# Patient Record
Sex: Female | Born: 1975
Health system: Southern US, Community
[De-identification: ages and names within clinical notes are randomized; demographics above are authoritative.]

## PROBLEM LIST (undated history)

## (undated) DIAGNOSIS — R0600 Dyspnea, unspecified: Secondary | ICD-10-CM

## (undated) DIAGNOSIS — I10 Essential (primary) hypertension: Secondary | ICD-10-CM

## (undated) DIAGNOSIS — E0500 Thyrotoxicosis with diffuse goiter without thyrotoxic crisis or storm: Secondary | ICD-10-CM

## (undated) DIAGNOSIS — R519 Headache, unspecified: Secondary | ICD-10-CM

## (undated) DIAGNOSIS — E039 Hypothyroidism, unspecified: Secondary | ICD-10-CM

## (undated) DIAGNOSIS — M797 Fibromyalgia: Secondary | ICD-10-CM

## (undated) HISTORY — PX: APPENDECTOMY: SHX54

## (undated) HISTORY — PX: TUBAL LIGATION: SHX77

## (undated) HISTORY — PX: OTHER SURGICAL HISTORY: SHX169

## (undated) HISTORY — PX: CHOLECYSTECTOMY: SHX55

---

## 2016-11-27 ENCOUNTER — Emergency Department (HOSPITAL_COMMUNITY)
Admission: EM | Admit: 2016-11-27 | Discharge: 2016-11-28 | Disposition: A | Discharge status: Home / Self Care | Payer: BLUE CROSS/BLUE SHIELD | Attending: Emergency Medicine | Admitting: Emergency Medicine

## 2016-11-27 ENCOUNTER — Encounter (HOSPITAL_COMMUNITY): Payer: Self-pay

## 2016-11-27 ENCOUNTER — Emergency Department (HOSPITAL_COMMUNITY): Payer: BLUE CROSS/BLUE SHIELD

## 2016-11-27 DIAGNOSIS — R1032 Left lower quadrant pain: Secondary | ICD-10-CM | POA: Diagnosis present

## 2016-11-27 DIAGNOSIS — K5732 Diverticulitis of large intestine without perforation or abscess without bleeding: Secondary | ICD-10-CM | POA: Diagnosis not present

## 2016-11-27 DIAGNOSIS — R1110 Vomiting, unspecified: Secondary | ICD-10-CM | POA: Insufficient documentation

## 2016-11-27 DIAGNOSIS — R197 Diarrhea, unspecified: Secondary | ICD-10-CM | POA: Insufficient documentation

## 2016-11-27 LAB — CBC
HEMATOCRIT: 40.1 % (ref 36.0–46.0)
HEMOGLOBIN: 13 g/dL (ref 12.0–15.0)
MCH: 26.5 pg (ref 26.0–34.0)
MCHC: 32.4 g/dL (ref 30.0–36.0)
MCV: 81.8 fL (ref 78.0–100.0)
Platelets: 327 10*3/uL (ref 150–400)
RBC: 4.9 MIL/uL (ref 3.87–5.11)
RDW: 15.4 % (ref 11.5–15.5)
WBC: 10.9 10*3/uL — ABNORMAL HIGH (ref 4.0–10.5)

## 2016-11-27 LAB — I-STAT BETA HCG BLOOD, ED (MC, WL, AP ONLY): I-stat hCG, quantitative: <5 m[IU]/mL (ref <5)

## 2016-11-27 LAB — URINALYSIS, ROUTINE W REFLEX MICROSCOPIC
BILIRUBIN URINE: NEGATIVE
Glucose, UA: NEGATIVE mg/dL
KETONES UR: 5 mg/dL — AB
Nitrite: NEGATIVE
PH: 5 (ref 5.0–8.0)
PROTEIN: NEGATIVE mg/dL
RBC / HPF: NONE SEEN RBC/hpf (ref 0–5)
Specific Gravity, Urine: 1.016 (ref 1.005–1.030)

## 2016-11-27 LAB — COMPREHENSIVE METABOLIC PANEL
ALBUMIN: 4.2 g/dL (ref 3.5–5.0)
ALT: 32 U/L (ref 14–54)
ANION GAP: 8 (ref 5–15)
AST: 26 U/L (ref 15–41)
Alkaline Phosphatase: 97 U/L (ref 38–126)
BUN: 10 mg/dL (ref 6–20)
CALCIUM: 9.1 mg/dL (ref 8.9–10.3)
CO2: 26 mmol/L (ref 22–32)
Chloride: 106 mmol/L (ref 101–111)
Creatinine, Ser: 1.05 mg/dL — ABNORMAL HIGH (ref 0.44–1.00)
GFR calc non Af Amer: >60 mL/min (ref >60)
GLUCOSE: 102 mg/dL — AB (ref 65–99)
POTASSIUM: 3.4 mmol/L — AB (ref 3.5–5.1)
SODIUM: 140 mmol/L (ref 135–145)
Total Bilirubin: 0.9 mg/dL (ref 0.3–1.2)
Total Protein: 8.1 g/dL (ref 6.5–8.1)

## 2016-11-27 LAB — LIPASE, BLOOD: LIPASE: 36 U/L (ref 11–51)

## 2016-11-27 MED ORDER — IOPAMIDOL (ISOVUE-300) INJECTION 61%
INTRAVENOUS | Status: AC
  Administered 2016-11-27: 100 mL via INTRAVENOUS
  Filled 2016-11-27: qty 100

## 2016-11-27 MED ORDER — ONDANSETRON HCL 4 MG/2ML IJ SOLN
4.0000 mg | Freq: Once | INTRAMUSCULAR | Status: AC
  Administered 2016-11-27: 4 mg via INTRAVENOUS
  Filled 2016-11-27: qty 2

## 2016-11-27 MED ORDER — SODIUM CHLORIDE 0.9 % IV BOLUS (SEPSIS)
1000.0000 mL | Freq: Once | INTRAVENOUS | Status: AC
  Administered 2016-11-27: 1000 mL via INTRAVENOUS

## 2016-11-27 NOTE — ED Notes (Addendum)
Patient given water for fluid challenge per provider.

## 2016-11-27 NOTE — ED Triage Notes (Signed)
Pt with nausea and abdominal pain since Wednesday.  Went to MD today and told to come to ed.  Pt having pain bilateral.  No emesis today.

## 2016-11-27 NOTE — ED Provider Notes (Signed)
[facility]-EMERGENCY DEPT Provider Note   CSN: [id number] Arrival date & time: 11/27/16  1509     History   Chief Complaint Chief Complaint  Patient presents with  . Abdominal Pain    HPI Patty Bruce is a 41 y.o. female who presents to the emergency department with constant, sharp LLQ abdominal pain that began 2 days ago. She reports associated anorexia, green emesis x1 two days ago with watery diarrhea that began 2 days ago and has persisted through today, and back pain. She also complains of a subjective fever that began yesterday, but has resolved today. She denies dysuria, vaginal pain, vaginal discharge. She reports she has treated her symptoms with one laxative 2 days ago without improvement. No sick contacts. No recent travel.  She reports she was evaluated earlier stay by urgent care, and advised to come to the emergency department for further evaluation.  Past medical history includes fibromyalgia. No daily medications.  The history is provided by the patient. No language interpreter was used.    History reviewed. No pertinent past medical history.  There are no active problems to display for this patient.   Past Surgical History:  Procedure Laterality Date  . APPENDECTOMY    . bladder mesh    . CHOLECYSTECTOMY    . TUBAL LIGATION      OB History    No data available       Home Medications    Prior to Admission medications   Medication Sig Start Date End Date Taking? Authorizing Provider  aspirin-acetaminophen-caffeine (EXCEDRIN MIGRAINE) [phone] MG tablet Take 1 tablet by mouth every 6 (six) hours as needed for headache.   Yes [provider]  naproxen sodium (ANAPROX) 220 MG tablet Take 220 mg by mouth 2 (two) times daily as needed (pain).   Yes [provider]  ciprofloxacin (CIPRO) 500 MG tablet Take 1 tablet (500 mg total) by mouth 2 (two) times daily. 11/28/16 12/05/16  Gaynor Ferreras A, PA-C  metroNIDAZOLE (FLAGYL) 500 MG tablet Take 1  tablet (500 mg total) by mouth 3 (three) times daily. 11/28/16   Lezly Rumpf A, PA-C  ondansetron (ZOFRAN) 4 MG tablet Take 1 tablet (4 mg total) by mouth every 6 (six) hours. 11/28/16   Ersa Delaney, Coral Else, PA-C    Family History History reviewed. No pertinent family history.  Social History Social History  Substance Use Topics  . Smoking status: Never Smoker  . Smokeless tobacco: Never Used  . Alcohol use Yes     Comment: social     Allergies   Vicodin [hydrocodone-acetaminophen]   Review of Systems Review of Systems  Constitutional: Positive for appetite change, chills and fever. Negative for activity change.  Respiratory: Negative for shortness of breath.   Cardiovascular: Negative for chest pain.  Gastrointestinal: Positive for abdominal pain, diarrhea, nausea and vomiting. Negative for anal bleeding and blood in stool.  Genitourinary: Positive for vaginal bleeding. Negative for dysuria, flank pain, hematuria, menstrual problem, pelvic pain and vaginal pain.  Musculoskeletal: Positive for back pain. Negative for gait problem.  Skin: Negative for rash.  Allergic/Immunologic: Negative for immunocompromised state.  Neurological: Positive for headaches.   Physical Exam Updated Vital Signs BP (!) 130/91 (BP Location: Right Arm)   Pulse 80   Temp 98.1 F (36.7 C) (Oral)   Resp 16   Ht 5\' 5"  (1.651 m)   Wt 104.8 kg (231 lb)   LMP 11/22/2016   SpO2 95%   BMI 38.44 kg/m   Physical  Exam  Constitutional: She appears well-developed and well-nourished. No distress.  HENT:  Head: Normocephalic.  Eyes: Conjunctivae are normal.  Neck: Neck supple.  Cardiovascular: Normal rate, regular rhythm, normal heart sounds and intact distal pulses.  Exam reveals no gallop and no friction rub.   No murmur heard. Pulmonary/Chest: Effort normal and breath sounds normal. No respiratory distress. She has no wheezes. She has no rales.  Abdominal: Soft. Bowel sounds are normal. She exhibits no  distension. There is tenderness. There is no guarding.  Tender to palpation over the left lower and upper quadrant and RUQ. No rebound or guarding. No CVA tenderness bilaterally. Obese abdomen.  Musculoskeletal: Normal range of motion. She exhibits tenderness. She exhibits no deformity.  Diffuse TTP over the bilateral paraspinal muscles of the lumbar spine.   Neurological: She is alert.  Skin: Skin is warm. No rash noted. She is not diaphoretic.  Psychiatric: Her behavior is normal.  Nursing note and vitals reviewed.  ED Treatments / Results  Labs (all labs ordered are listed, but only abnormal results are displayed) Labs Reviewed  COMPREHENSIVE METABOLIC PANEL - Abnormal; Notable for the following:       Result Value   Potassium 3.4 (*)    Glucose, Bld 102 (*)    Creatinine, Ser 1.05 (*)    All other components within normal limits  CBC - Abnormal; Notable for the following:    WBC 10.9 (*)    All other components within normal limits  URINALYSIS, ROUTINE W REFLEX MICROSCOPIC - Abnormal; Notable for the following:    Hgb urine dipstick MODERATE (*)    Ketones, ur 5 (*)    Leukocytes, UA TRACE (*)    Bacteria, UA RARE (*)    Squamous Epithelial / LPF 0-5 (*)    All other components within normal limits  LIPASE, BLOOD  I-STAT BETA HCG BLOOD, ED ([facility], [facility], [facility] ONLY)    EKG  EKG Interpretation None       Radiology Ct Abdomen Pelvis W Contrast  Result Date: 11/27/2016 CLINICAL DATA:  Left lower quadrant pain EXAM: CT ABDOMEN AND PELVIS WITH CONTRAST TECHNIQUE: Multidetector CT imaging of the abdomen and pelvis was performed using the standard protocol following bolus administration of intravenous contrast. CONTRAST:  100 mL Isovue-300 COMPARISON:  None. FINDINGS: Lower chest: No pulmonary nodules or pleural effusion. No visible pericardial effusion. Hepatobiliary: Normal hepatic contours and density. No visible biliary dilatation. Status post cholecystectomy. Pancreas: Normal  contours without ductal dilatation. No peripancreatic fluid collection. Spleen: Normal. Adrenals/Urinary Tract: --Adrenal glands: Normal. --Right kidney/ureter: No hydronephrosis or perinephric stranding. No nephrolithiasis. No obstructing ureteral stones. --Left kidney/ureter: No hydronephrosis or perinephric stranding. No nephrolithiasis. No obstructing ureteral stones. --Urinary bladder: Unremarkable. Stomach/Bowel: --Stomach/Duodenum: No hiatal hernia or other gastric abnormality. Normal duodenal course and caliber. --Small bowel: No dilatation or inflammation. --Colon: There is short segment inflammation of the left lower quadrant sigmoid colon. No free intraperitoneal air or abscess formation. No other focal colonic abnormality. --Appendix: Not visualized. No right lower quadrant inflammation or free fluid. Vascular/Lymphatic: Normal course and caliber of the major abdominal vessels. No abdominal or pelvic lymphadenopathy. Reproductive: Normal uterus and ovaries. Musculoskeletal. No bony spinal canal stenosis or focal osseous abnormality. Other: None. IMPRESSION: Short segment inflammation of the left lower quadrant sigmoid colon, consistent with acute colitis. No perforation or abscess formation. Electronically Signed   By: Deatra Robinson M.D.   On: 11/27/2016 22:34    Procedures Procedures (including critical care time)  Medications  Ordered in ED Medications  metroNIDAZOLE (FLAGYL) tablet 500 mg (500 mg Oral Given 11/28/16 0031)  sodium chloride 0.9 % bolus 1,000 mL (0 mLs Intravenous Stopped 11/28/16 0054)  ondansetron (ZOFRAN) injection 4 mg (4 mg Intravenous Given 11/27/16 2233)  iopamidol (ISOVUE-300) 61 % injection (100 mLs Intravenous Contrast Given 11/27/16 2212)  ciprofloxacin (CIPRO) tablet 500 mg (500 mg Oral Given 11/28/16 0031)     Initial Impression / Assessment and Plan / ED Course  I have reviewed the triage vital signs and the nursing notes.  Pertinent labs & imaging results that  were available during my care of the patient were reviewed by me and considered in my medical decision making (see chart for details).     41 year old female presenting with abdominal pain, nausea, vomiting, and diarrhea. Afebrile. No tachycardia.WBC 10.9. CT demonstrating colitis in the sigmoid colon, concerning for diverticulitis. The patient was discussed with Dr. Ranae PalmsYelverton, attending physician, and on review of the CT images, there appeared to be several small diverticulum noted in the sigmoid colon. Fluid bolus and IV Zofran given in the ED. The patient was successfully fluid challenged. Will discharge the patient with GI follow-up, ciprofloxacin, and metronidazole; first dose given in the ED. discussed the plan for discharge with the patient, who is agreeable at this time. No acute distress. Vital signs stable. The patient is safe for discharge at this time.  Final Clinical Impressions(s) / ED Diagnoses   Final diagnoses:  Diverticulitis of large intestine without perforation or abscess without bleeding    New Prescriptions Discharge Medication List as of 11/28/2016 12:45 AM    START taking these medications   Details  ondansetron (ZOFRAN) 4 MG tablet Take 1 tablet (4 mg total) by mouth every 6 (six) hours., Starting Sat 11/28/2016, Print    ciprofloxacin (CIPRO) 500 MG tablet Take 1 tablet (500 mg total) by mouth 2 (two) times daily., Starting Sat 11/28/2016, Until Sat 12/05/2016, Print    metroNIDAZOLE (FLAGYL) 500 MG tablet Take 1 tablet (500 mg total) by mouth 2 (two) times daily., Starting Sat 11/28/2016, Print         Mahrukh Seguin A, PA-C 11/28/16 [id number]    Loren RacerYelverton, David, MD 11/29/16 [phone]

## 2016-11-27 NOTE — ED Notes (Signed)
Vital signs delayed due to patient being in CT.

## 2016-11-28 MED ORDER — METRONIDAZOLE 500 MG PO TABS: 500.0000 mg | ORAL_TABLET | Freq: Three times a day (TID) | ORAL | 0 refills | Status: AC

## 2016-11-28 MED ORDER — CIPROFLOXACIN HCL 500 MG PO TABS
500.0000 mg | ORAL_TABLET | Freq: Once | ORAL | Status: AC
  Administered 2016-11-28: 500 mg via ORAL
  Filled 2016-11-28: qty 1

## 2016-11-28 MED ORDER — METRONIDAZOLE 500 MG PO TABS: 500.0000 mg | ORAL_TABLET | Freq: Two times a day (BID) | ORAL | 0 refills | Status: DC

## 2016-11-28 MED ORDER — METRONIDAZOLE 500 MG PO TABS
500.0000 mg | ORAL_TABLET | Freq: Three times a day (TID) | ORAL | Status: DC
  Administered 2016-11-28: 500 mg via ORAL
  Filled 2016-11-28: qty 1

## 2016-11-28 MED ORDER — ONDANSETRON HCL 4 MG PO TABS: 4.0000 mg | ORAL_TABLET | Freq: Four times a day (QID) | ORAL | 0 refills | Status: AC

## 2016-11-28 MED ORDER — CIPROFLOXACIN HCL 500 MG PO TABS: 500.0000 mg | ORAL_TABLET | Freq: Two times a day (BID) | ORAL | 0 refills | Status: AC

## 2016-11-28 MED ORDER — CIPROFLOXACIN HCL 500 MG PO TABS: 500.0000 mg | ORAL_TABLET | Freq: Two times a day (BID) | ORAL | 0 refills | Status: DC

## 2016-11-28 NOTE — ED Notes (Signed)
Patient tolerating fluid well.

## 2016-11-28 NOTE — Discharge Instructions (Signed)
Please take ciprofloxacin once every 12 hours and metronidazole once every 8 hours for the next 7 days. Please follow a bland diet or a clear liquid diet until her symptoms improve. You may take Tylenol as needed if you develop a fever. You may take 1 tablet of Zofran every 6 hours as needed for nausea or vomiting.  Please call to schedule a [facility] if symptoms persist.  If you develop any new or worsening symptoms, including the inability to keep down fluids or liquids, bloody diarrhea, or severe worsening pain, particularly after he been taking antibiotics for 48 hours, please return to the emergency department for reevaluation.

## 2017-02-08 ENCOUNTER — Other Ambulatory Visit: Payer: Self-pay | Admitting: Gastroenterology

## 2017-02-08 DIAGNOSIS — K5792 Diverticulitis of intestine, part unspecified, without perforation or abscess without bleeding: Secondary | ICD-10-CM

## 2017-02-08 DIAGNOSIS — R1032 Left lower quadrant pain: Secondary | ICD-10-CM

## 2017-02-10 ENCOUNTER — Ambulatory Visit
Admission: RE | Admit: 2017-02-10 | Discharge: 2017-02-10 | Disposition: A | Discharge status: Home / Self Care | Payer: BLUE CROSS/BLUE SHIELD | Source: Ambulatory Visit | Attending: Gastroenterology | Admitting: Gastroenterology

## 2017-02-10 DIAGNOSIS — R1032 Left lower quadrant pain: Secondary | ICD-10-CM

## 2017-02-10 DIAGNOSIS — K5792 Diverticulitis of intestine, part unspecified, without perforation or abscess without bleeding: Secondary | ICD-10-CM

## 2017-02-10 MED ORDER — IOPAMIDOL (ISOVUE-300) INJECTION 61%
125.0000 mL | Freq: Once | INTRAVENOUS | Status: AC | PRN
  Administered 2017-02-10: 125 mL via INTRAVENOUS

## 2017-02-17 ENCOUNTER — Other Ambulatory Visit (HOSPITAL_COMMUNITY)
Admission: RE | Admit: 2017-02-17 | Discharge: 2017-02-17 | Disposition: A | Discharge status: Home / Self Care | Payer: BLUE CROSS/BLUE SHIELD | Source: Ambulatory Visit | Attending: Family Medicine | Admitting: Family Medicine

## 2017-02-17 ENCOUNTER — Other Ambulatory Visit: Payer: Self-pay | Admitting: Family Medicine

## 2017-02-17 DIAGNOSIS — Z01419 Encounter for gynecological examination (general) (routine) without abnormal findings: Secondary | ICD-10-CM | POA: Diagnosis not present

## 2017-02-18 LAB — CYTOLOGY - PAP: DIAGNOSIS: NEGATIVE

## 2017-04-02 ENCOUNTER — Other Ambulatory Visit: Payer: Self-pay | Admitting: Physician Assistant

## 2017-04-02 ENCOUNTER — Ambulatory Visit
Admission: RE | Admit: 2017-04-02 | Discharge: 2017-04-02 | Disposition: A | Discharge status: Home / Self Care | Payer: BLUE CROSS/BLUE SHIELD | Source: Ambulatory Visit | Attending: Physician Assistant | Admitting: Physician Assistant

## 2017-04-02 DIAGNOSIS — K5792 Diverticulitis of intestine, part unspecified, without perforation or abscess without bleeding: Secondary | ICD-10-CM

## 2017-04-02 DIAGNOSIS — R1032 Left lower quadrant pain: Secondary | ICD-10-CM

## 2017-04-02 MED ORDER — IOPAMIDOL (ISOVUE-300) INJECTION 61%
125.0000 mL | Freq: Once | INTRAVENOUS | Status: AC | PRN
  Administered 2017-04-02: 125 mL via INTRAVENOUS

## 2017-05-13 ENCOUNTER — Other Ambulatory Visit: Payer: Self-pay | Admitting: Obstetrics & Gynecology

## 2017-06-21 DIAGNOSIS — Z113 Encounter for screening for infections with a predominantly sexual mode of transmission: Secondary | ICD-10-CM | POA: Diagnosis not present

## 2017-06-21 DIAGNOSIS — N760 Acute vaginitis: Secondary | ICD-10-CM | POA: Diagnosis not present

## 2017-09-20 ENCOUNTER — Ambulatory Visit
Admission: RE | Admit: 2017-09-20 | Discharge: 2017-09-20 | Disposition: A | Discharge status: Home / Self Care | Payer: BLUE CROSS/BLUE SHIELD | Source: Ambulatory Visit | Attending: Physician Assistant | Admitting: Physician Assistant

## 2017-09-20 ENCOUNTER — Other Ambulatory Visit: Payer: Self-pay | Admitting: Physician Assistant

## 2017-09-20 DIAGNOSIS — M79642 Pain in left hand: Secondary | ICD-10-CM | POA: Diagnosis not present

## 2017-09-20 DIAGNOSIS — S6992XA Unspecified injury of left wrist, hand and finger(s), initial encounter: Secondary | ICD-10-CM

## 2017-09-20 DIAGNOSIS — S62641A Nondisplaced fracture of proximal phalanx of left index finger, initial encounter for closed fracture: Secondary | ICD-10-CM | POA: Diagnosis not present

## 2017-09-24 DIAGNOSIS — G5601 Carpal tunnel syndrome, right upper limb: Secondary | ICD-10-CM | POA: Diagnosis not present

## 2017-09-24 DIAGNOSIS — M79645 Pain in left finger(s): Secondary | ICD-10-CM | POA: Diagnosis not present

## 2017-09-24 DIAGNOSIS — S62617A Displaced fracture of proximal phalanx of left little finger, initial encounter for closed fracture: Secondary | ICD-10-CM | POA: Diagnosis not present

## 2017-09-29 DIAGNOSIS — N939 Abnormal uterine and vaginal bleeding, unspecified: Secondary | ICD-10-CM | POA: Diagnosis not present

## 2017-09-29 DIAGNOSIS — Z01818 Encounter for other preprocedural examination: Secondary | ICD-10-CM | POA: Diagnosis not present

## 2017-10-04 DIAGNOSIS — S62617S Displaced fracture of proximal phalanx of left little finger, sequela: Secondary | ICD-10-CM | POA: Diagnosis not present

## 2017-10-04 DIAGNOSIS — Z4789 Encounter for other orthopedic aftercare: Secondary | ICD-10-CM | POA: Diagnosis not present

## 2017-10-04 DIAGNOSIS — M79645 Pain in left finger(s): Secondary | ICD-10-CM | POA: Diagnosis not present

## 2018-05-25 DIAGNOSIS — R070 Pain in throat: Secondary | ICD-10-CM | POA: Diagnosis not present

## 2018-05-25 DIAGNOSIS — R0981 Nasal congestion: Secondary | ICD-10-CM | POA: Diagnosis not present

## 2018-05-25 DIAGNOSIS — R509 Fever, unspecified: Secondary | ICD-10-CM | POA: Diagnosis not present

## 2019-05-02 DIAGNOSIS — R3989 Other symptoms and signs involving the genitourinary system: Secondary | ICD-10-CM | POA: Diagnosis not present

## 2019-05-02 DIAGNOSIS — L298 Other pruritus: Secondary | ICD-10-CM | POA: Diagnosis not present

## 2019-05-02 DIAGNOSIS — Z131 Encounter for screening for diabetes mellitus: Secondary | ICD-10-CM | POA: Diagnosis not present

## 2019-05-02 DIAGNOSIS — N920 Excessive and frequent menstruation with regular cycle: Secondary | ICD-10-CM | POA: Diagnosis not present

## 2019-05-02 DIAGNOSIS — A609 Anogenital herpesviral infection, unspecified: Secondary | ICD-10-CM | POA: Diagnosis not present

## 2019-05-08 DIAGNOSIS — R946 Abnormal results of thyroid function studies: Secondary | ICD-10-CM | POA: Diagnosis not present

## 2019-05-08 DIAGNOSIS — R945 Abnormal results of liver function studies: Secondary | ICD-10-CM | POA: Diagnosis not present

## 2019-05-18 DIAGNOSIS — Z8379 Family history of other diseases of the digestive system: Secondary | ICD-10-CM | POA: Diagnosis not present

## 2019-05-18 DIAGNOSIS — Z8349 Family history of other endocrine, nutritional and metabolic diseases: Secondary | ICD-10-CM | POA: Diagnosis not present

## 2019-05-18 DIAGNOSIS — D509 Iron deficiency anemia, unspecified: Secondary | ICD-10-CM | POA: Diagnosis not present

## 2019-05-18 DIAGNOSIS — E0590 Thyrotoxicosis, unspecified without thyrotoxic crisis or storm: Secondary | ICD-10-CM | POA: Diagnosis not present

## 2019-05-29 DIAGNOSIS — E0590 Thyrotoxicosis, unspecified without thyrotoxic crisis or storm: Secondary | ICD-10-CM | POA: Diagnosis not present

## 2019-06-16 DIAGNOSIS — E0500 Thyrotoxicosis with diffuse goiter without thyrotoxic crisis or storm: Secondary | ICD-10-CM | POA: Diagnosis not present

## 2019-06-20 DIAGNOSIS — Z8349 Family history of other endocrine, nutritional and metabolic diseases: Secondary | ICD-10-CM | POA: Diagnosis not present

## 2019-06-20 DIAGNOSIS — E0590 Thyrotoxicosis, unspecified without thyrotoxic crisis or storm: Secondary | ICD-10-CM | POA: Diagnosis not present

## 2019-06-20 DIAGNOSIS — E0500 Thyrotoxicosis with diffuse goiter without thyrotoxic crisis or storm: Secondary | ICD-10-CM | POA: Diagnosis not present

## 2019-06-21 ENCOUNTER — Other Ambulatory Visit: Payer: Self-pay | Admitting: Internal Medicine

## 2019-06-21 ENCOUNTER — Other Ambulatory Visit (HOSPITAL_COMMUNITY): Payer: Self-pay | Admitting: Internal Medicine

## 2019-06-21 DIAGNOSIS — E0500 Thyrotoxicosis with diffuse goiter without thyrotoxic crisis or storm: Secondary | ICD-10-CM

## 2019-06-21 DIAGNOSIS — E0590 Thyrotoxicosis, unspecified without thyrotoxic crisis or storm: Secondary | ICD-10-CM

## 2019-07-05 ENCOUNTER — Encounter (HOSPITAL_COMMUNITY)
Admission: RE | Admit: 2019-07-05 | Discharge: 2019-07-05 | Disposition: A | Discharge status: Home / Self Care | Payer: BC Managed Care – PPO | Source: Ambulatory Visit | Attending: Internal Medicine | Admitting: Internal Medicine

## 2019-07-05 ENCOUNTER — Other Ambulatory Visit: Payer: Self-pay

## 2019-07-05 DIAGNOSIS — E0590 Thyrotoxicosis, unspecified without thyrotoxic crisis or storm: Secondary | ICD-10-CM

## 2019-07-05 DIAGNOSIS — E0500 Thyrotoxicosis with diffuse goiter without thyrotoxic crisis or storm: Secondary | ICD-10-CM

## 2019-07-05 MED ORDER — SODIUM IODIDE I-123 7.4 MBQ CAPS
400.0000 | ORAL_CAPSULE | Freq: Once | ORAL | Status: AC
  Administered 2019-07-05: 14:00:00 347 via ORAL

## 2019-07-06 ENCOUNTER — Encounter (HOSPITAL_COMMUNITY)
Admission: RE | Admit: 2019-07-06 | Discharge: 2019-07-06 | Disposition: A | Discharge status: Home / Self Care | Payer: BC Managed Care – PPO | Source: Ambulatory Visit | Attending: Internal Medicine | Admitting: Internal Medicine

## 2019-07-06 DIAGNOSIS — E0590 Thyrotoxicosis, unspecified without thyrotoxic crisis or storm: Secondary | ICD-10-CM | POA: Diagnosis not present

## 2019-07-13 ENCOUNTER — Other Ambulatory Visit (HOSPITAL_COMMUNITY): Payer: Self-pay | Admitting: Internal Medicine

## 2019-07-28 ENCOUNTER — Other Ambulatory Visit (HOSPITAL_COMMUNITY): Payer: Self-pay | Admitting: Internal Medicine

## 2019-07-28 ENCOUNTER — Other Ambulatory Visit: Payer: Self-pay

## 2019-07-28 ENCOUNTER — Encounter (HOSPITAL_COMMUNITY)
Admission: RE | Admit: 2019-07-28 | Discharge: 2019-07-28 | Disposition: A | Discharge status: Home / Self Care | Payer: BC Managed Care – PPO | Source: Ambulatory Visit | Attending: Internal Medicine | Admitting: Internal Medicine

## 2019-07-28 DIAGNOSIS — E0590 Thyrotoxicosis, unspecified without thyrotoxic crisis or storm: Secondary | ICD-10-CM

## 2019-07-28 DIAGNOSIS — E0500 Thyrotoxicosis with diffuse goiter without thyrotoxic crisis or storm: Secondary | ICD-10-CM | POA: Diagnosis not present

## 2019-07-28 LAB — HCG, SERUM, QUALITATIVE: Preg, Serum: NEGATIVE

## 2019-07-28 MED ORDER — SODIUM IODIDE I 131 CAPSULE
12.5000 | Freq: Once | INTRAVENOUS | Status: AC | PRN
  Administered 2019-07-28: 12.5 via ORAL

## 2019-08-03 DIAGNOSIS — E0590 Thyrotoxicosis, unspecified without thyrotoxic crisis or storm: Secondary | ICD-10-CM | POA: Diagnosis not present

## 2019-08-14 DIAGNOSIS — N3000 Acute cystitis without hematuria: Secondary | ICD-10-CM | POA: Diagnosis not present

## 2019-08-14 DIAGNOSIS — R350 Frequency of micturition: Secondary | ICD-10-CM | POA: Diagnosis not present

## 2019-08-14 DIAGNOSIS — E0500 Thyrotoxicosis with diffuse goiter without thyrotoxic crisis or storm: Secondary | ICD-10-CM | POA: Diagnosis not present

## 2019-08-14 DIAGNOSIS — R079 Chest pain, unspecified: Secondary | ICD-10-CM | POA: Diagnosis not present

## 2019-08-14 DIAGNOSIS — I10 Essential (primary) hypertension: Secondary | ICD-10-CM | POA: Diagnosis not present

## 2019-08-17 DIAGNOSIS — Z01411 Encounter for gynecological examination (general) (routine) with abnormal findings: Secondary | ICD-10-CM | POA: Diagnosis not present

## 2019-08-17 DIAGNOSIS — N920 Excessive and frequent menstruation with regular cycle: Secondary | ICD-10-CM | POA: Diagnosis not present

## 2019-08-17 DIAGNOSIS — I10 Essential (primary) hypertension: Secondary | ICD-10-CM | POA: Diagnosis not present

## 2019-08-17 DIAGNOSIS — N393 Stress incontinence (female) (male): Secondary | ICD-10-CM | POA: Diagnosis not present

## 2019-09-01 DIAGNOSIS — E0590 Thyrotoxicosis, unspecified without thyrotoxic crisis or storm: Secondary | ICD-10-CM | POA: Diagnosis not present

## 2019-09-01 DIAGNOSIS — E0500 Thyrotoxicosis with diffuse goiter without thyrotoxic crisis or storm: Secondary | ICD-10-CM | POA: Diagnosis not present

## 2019-09-13 DIAGNOSIS — E0500 Thyrotoxicosis with diffuse goiter without thyrotoxic crisis or storm: Secondary | ICD-10-CM | POA: Diagnosis not present

## 2019-09-13 DIAGNOSIS — R3915 Urgency of urination: Secondary | ICD-10-CM | POA: Diagnosis not present

## 2019-09-13 DIAGNOSIS — R079 Chest pain, unspecified: Secondary | ICD-10-CM | POA: Diagnosis not present

## 2019-09-13 DIAGNOSIS — I10 Essential (primary) hypertension: Secondary | ICD-10-CM | POA: Diagnosis not present

## 2019-09-15 DIAGNOSIS — Z3202 Encounter for pregnancy test, result negative: Secondary | ICD-10-CM | POA: Diagnosis not present

## 2019-09-15 DIAGNOSIS — N920 Excessive and frequent menstruation with regular cycle: Secondary | ICD-10-CM | POA: Diagnosis not present

## 2019-09-29 DIAGNOSIS — E0590 Thyrotoxicosis, unspecified without thyrotoxic crisis or storm: Secondary | ICD-10-CM | POA: Diagnosis not present

## 2019-09-29 DIAGNOSIS — E0500 Thyrotoxicosis with diffuse goiter without thyrotoxic crisis or storm: Secondary | ICD-10-CM | POA: Diagnosis not present

## 2019-10-24 DIAGNOSIS — Z13220 Encounter for screening for lipoid disorders: Secondary | ICD-10-CM | POA: Diagnosis not present

## 2019-10-24 DIAGNOSIS — Z0000 Encounter for general adult medical examination without abnormal findings: Secondary | ICD-10-CM | POA: Diagnosis not present

## 2019-10-24 DIAGNOSIS — E0500 Thyrotoxicosis with diffuse goiter without thyrotoxic crisis or storm: Secondary | ICD-10-CM | POA: Diagnosis not present

## 2019-10-24 DIAGNOSIS — I10 Essential (primary) hypertension: Secondary | ICD-10-CM | POA: Diagnosis not present

## 2019-10-24 DIAGNOSIS — R350 Frequency of micturition: Secondary | ICD-10-CM | POA: Diagnosis not present

## 2019-11-02 DIAGNOSIS — E0500 Thyrotoxicosis with diffuse goiter without thyrotoxic crisis or storm: Secondary | ICD-10-CM | POA: Diagnosis not present

## 2019-11-02 DIAGNOSIS — Z8349 Family history of other endocrine, nutritional and metabolic diseases: Secondary | ICD-10-CM | POA: Diagnosis not present

## 2019-11-02 DIAGNOSIS — E890 Postprocedural hypothyroidism: Secondary | ICD-10-CM | POA: Diagnosis not present

## 2019-11-14 DIAGNOSIS — Z9889 Other specified postprocedural states: Secondary | ICD-10-CM | POA: Diagnosis not present

## 2019-11-14 DIAGNOSIS — R3989 Other symptoms and signs involving the genitourinary system: Secondary | ICD-10-CM | POA: Diagnosis not present

## 2019-11-14 DIAGNOSIS — E0590 Thyrotoxicosis, unspecified without thyrotoxic crisis or storm: Secondary | ICD-10-CM | POA: Diagnosis not present

## 2019-11-14 DIAGNOSIS — N898 Other specified noninflammatory disorders of vagina: Secondary | ICD-10-CM | POA: Diagnosis not present

## 2019-11-24 DIAGNOSIS — I10 Essential (primary) hypertension: Secondary | ICD-10-CM | POA: Diagnosis not present

## 2019-11-24 DIAGNOSIS — R300 Dysuria: Secondary | ICD-10-CM | POA: Diagnosis not present

## 2020-01-09 DIAGNOSIS — E0500 Thyrotoxicosis with diffuse goiter without thyrotoxic crisis or storm: Secondary | ICD-10-CM | POA: Diagnosis not present

## 2020-01-09 DIAGNOSIS — E890 Postprocedural hypothyroidism: Secondary | ICD-10-CM | POA: Diagnosis not present

## 2020-02-20 DIAGNOSIS — E0500 Thyrotoxicosis with diffuse goiter without thyrotoxic crisis or storm: Secondary | ICD-10-CM | POA: Diagnosis not present

## 2020-02-20 DIAGNOSIS — E890 Postprocedural hypothyroidism: Secondary | ICD-10-CM | POA: Diagnosis not present

## 2020-02-27 DIAGNOSIS — R350 Frequency of micturition: Secondary | ICD-10-CM | POA: Diagnosis not present

## 2020-02-27 DIAGNOSIS — I10 Essential (primary) hypertension: Secondary | ICD-10-CM | POA: Diagnosis not present

## 2020-02-27 DIAGNOSIS — J0100 Acute maxillary sinusitis, unspecified: Secondary | ICD-10-CM | POA: Diagnosis not present

## 2020-08-21 DIAGNOSIS — K5900 Constipation, unspecified: Secondary | ICD-10-CM | POA: Diagnosis not present

## 2020-08-21 DIAGNOSIS — Z86010 Personal history of colonic polyps: Secondary | ICD-10-CM | POA: Diagnosis not present

## 2020-08-21 DIAGNOSIS — K5792 Diverticulitis of intestine, part unspecified, without perforation or abscess without bleeding: Secondary | ICD-10-CM | POA: Diagnosis not present

## 2020-08-27 ENCOUNTER — Other Ambulatory Visit: Payer: Self-pay | Admitting: Physician Assistant

## 2020-08-27 DIAGNOSIS — K5792 Diverticulitis of intestine, part unspecified, without perforation or abscess without bleeding: Secondary | ICD-10-CM

## 2020-12-24 DIAGNOSIS — E890 Postprocedural hypothyroidism: Secondary | ICD-10-CM | POA: Diagnosis not present

## 2020-12-31 DIAGNOSIS — E0500 Thyrotoxicosis with diffuse goiter without thyrotoxic crisis or storm: Secondary | ICD-10-CM | POA: Diagnosis not present

## 2020-12-31 DIAGNOSIS — E890 Postprocedural hypothyroidism: Secondary | ICD-10-CM | POA: Diagnosis not present

## 2021-01-03 DIAGNOSIS — R8290 Unspecified abnormal findings in urine: Secondary | ICD-10-CM | POA: Diagnosis not present

## 2021-01-03 DIAGNOSIS — Z13220 Encounter for screening for lipoid disorders: Secondary | ICD-10-CM | POA: Diagnosis not present

## 2021-01-03 DIAGNOSIS — Z23 Encounter for immunization: Secondary | ICD-10-CM | POA: Diagnosis not present

## 2021-01-03 DIAGNOSIS — Z0000 Encounter for general adult medical examination without abnormal findings: Secondary | ICD-10-CM | POA: Diagnosis not present

## 2021-01-03 DIAGNOSIS — R079 Chest pain, unspecified: Secondary | ICD-10-CM | POA: Diagnosis not present

## 2021-01-03 DIAGNOSIS — I10 Essential (primary) hypertension: Secondary | ICD-10-CM | POA: Diagnosis not present

## 2021-01-08 IMAGING — NM NM RAI THERAPY FOR HYPERTHYROIDISM
1 series · 1 of 1 positions shown · non-contrast
Comparison: Thyroid uptake and scan 07/06/2019

CLINICAL DATA: Hyperthyroidism Graves disease. Patient complains of
night sweats, nervousness and irritability. TSH suppressed at 0.01.
Elevated thyroid uptake on comparison scan

EXAM:
RADIOACTIVE IODINE THERAPY FOR HYPERTHYROIDISM
TECHNIQUE: Radioactive iodine prescribed by Dr. Pigeon. The risks and benefits
of radioactive iodine therapy were discussed with the patient in
detail by Dr. Landscape. Alternative therapies were also mentioned.
Radiation safety was discussed with the patient, including how to
protect the general public from exposure. There were no barriers to
communication. Written consent was obtained. The patient then
received a capsule containing the radiopharmaceutical.
The patient will follow-up with the referring physician.
RADIOPHARMACEUTICALS:  12.5 mCi D-F6F sodium iodide orally

[Series 1: 0 min · 4.14mm/px · 1 of 1 slices shown]
[im 1/1]
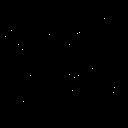

[1 of 1 positions shown; findings below may reference images not displayed]

IMPRESSION: Per oral administration of D-F6F sodium iodide for the treatment of
Graves disease.

## 2021-07-04 ENCOUNTER — Other Ambulatory Visit: Payer: Self-pay

## 2021-07-04 ENCOUNTER — Ambulatory Visit
Admission: RE | Admit: 2021-07-04 | Discharge: 2021-07-04 | Disposition: A | Discharge status: Home / Self Care | Payer: BC Managed Care – PPO | Source: Ambulatory Visit | Attending: Gastroenterology | Admitting: Gastroenterology

## 2021-07-04 ENCOUNTER — Other Ambulatory Visit: Payer: Self-pay | Admitting: Gastroenterology

## 2021-07-04 DIAGNOSIS — R1032 Left lower quadrant pain: Secondary | ICD-10-CM | POA: Diagnosis not present

## 2021-07-04 DIAGNOSIS — K5792 Diverticulitis of intestine, part unspecified, without perforation or abscess without bleeding: Secondary | ICD-10-CM | POA: Diagnosis not present

## 2021-07-04 DIAGNOSIS — K3189 Other diseases of stomach and duodenum: Secondary | ICD-10-CM | POA: Diagnosis not present

## 2021-07-04 DIAGNOSIS — Z9049 Acquired absence of other specified parts of digestive tract: Secondary | ICD-10-CM | POA: Diagnosis not present

## 2021-07-04 MED ORDER — IOPAMIDOL (ISOVUE-300) INJECTION 61%
100.0000 mL | Freq: Once | INTRAVENOUS | Status: AC | PRN
  Administered 2021-07-04: 100 mL via INTRAVENOUS

## 2021-07-22 DIAGNOSIS — E890 Postprocedural hypothyroidism: Secondary | ICD-10-CM | POA: Diagnosis not present

## 2021-07-22 DIAGNOSIS — I10 Essential (primary) hypertension: Secondary | ICD-10-CM | POA: Diagnosis not present

## 2021-07-23 ENCOUNTER — Other Ambulatory Visit: Payer: Self-pay | Admitting: Family Medicine

## 2021-07-24 ENCOUNTER — Ambulatory Visit: Payer: Self-pay | Admitting: General Surgery

## 2021-07-24 DIAGNOSIS — K5792 Diverticulitis of intestine, part unspecified, without perforation or abscess without bleeding: Secondary | ICD-10-CM | POA: Diagnosis not present

## 2021-07-24 NOTE — H&P (Signed)
Chief Complaint: No chief complaint on file. ?  ?  ?History of Present Illness: ?Patty Bruce is a 46 y.o. female who is seen today as an office consultation for evaluation of No chief complaint on file. ? .   ?  ?Patient is a 46 year old female, who comes in secondary to a multiple episode history of diverticulitis. ?Patient has a history of hypothyroidism, hypertension. ?  ?Patient states that she had multiple episodes of the last 3 years of diverticulitis.  Patient did have previous colonoscopy which revealed diverticuli.  There were no further findings per the patient. ?  ?Patient's had multiple episodes requiring antibiotics in the recent past she states that she just finished antibiotics and the most recent episode.  She states that she would like to have surgery to help prevent any further episodes. ?  ?She has had previous appendectomy, cholecystectomy, tubal ligation and bladder mesh in the past.  These were laparoscopic. ?  ?Of note I did review the CT scan.  CT scan was significant for diverticulitis, minimal perisigmoid inflammation. ?  ?   ?Review of Systems: ?A complete review of systems was obtained from the patient.  I have reviewed this information and discussed as appropriate with the patient.  See HPI as well for other ROS. ?  ?Review of Systems  ?Constitutional: Negative for fever.  ?HENT: Negative for congestion.   ?Eyes: Negative for blurred vision.  ?Respiratory: Negative for cough, shortness of breath and wheezing.   ?Cardiovascular: Negative for chest pain and palpitations.  ?Gastrointestinal: Positive for abdominal pain. Negative for heartburn.  ?Genitourinary: Negative for dysuria.  ?Musculoskeletal: Negative for myalgias.  ?Skin: Negative for rash.  ?Neurological: Negative for dizziness and headaches.  ?Psychiatric/Behavioral: Negative for depression and suicidal ideas.  ?All other systems reviewed and are negative. ?  ?  ?Medical History: ?Past Medical History ?Past Medical  History: ?Diagnosis Date ? Anxiety   ? Thyroid disease   ? ?  ?  ?There is no problem list on file for this patient. ?  ?  ?Past Surgical History ?Past Surgical History: ?Procedure Laterality Date ? APPENDECTOMY     ? CHOLECYSTECTOMY     ? ESSURE TUBAL LIGATION     ? ?  ?  ?Allergies ?Allergies ?Allergen Reactions ? Hydrocodone-Acetaminophen Other (See Comments) ?    "feels weird" ?  ? Latex Other (See Comments) ? ?  ?  ?Current Outpatient Medications on File Prior to Visit ?Medication Sig Dispense Refill ? levothyroxine (SYNTHROID) 175 MCG tablet 1 tablet in the morning on an empty stomach     ? polyethylene glycol (MIRALAX) powder Take 17 g by mouth once daily Mix in 4-8ounces of fluid prior to taking.     ? amLODIPine (NORVASC) 5 MG tablet Take 5 mg by mouth once daily     ? valACYclovir (VALTREX) 1000 MG tablet 1 tablet     ? valsartan (DIOVAN) 320 MG tablet 1 tablet     ?  ?No current facility-administered medications on file prior to visit. ?  ?  ?Family History ?Family History ?Problem Relation Age of Onset ? Obesity Mother   ? High blood pressure (Hypertension) Mother   ? Obesity Sister   ? ?  ?  ?Social History ?  ?Tobacco Use ?Smoking Status Never ?Smokeless Tobacco Never ?  ?  ?Social History ?Social History ?  ? ?Socioeconomic History ? Marital status: Single ?Tobacco Use ? Smoking status: Never ? Smokeless tobacco: Never ?Vaping Use ? Vaping Use: Never  used ?Substance and Sexual Activity ? Alcohol use: Yes ? Drug use: Never ? ?  ?  ?Objective: ?  ?Vitals: ?  07/24/21 1048 ?BP: 122/76 ?Pulse: 101 ?Temp: 36.8 ?C (98.2 ?F) ?SpO2: 96% ?Weight: (!) 122.5 kg (270 lb) ?Height: 165.1 cm (5\' 5" ) ?  ?Body mass index is 44.93 kg/m?. ?  ?Physical Exam ?Constitutional:   ?   Appearance: Normal appearance.  ?HENT:  ?   Head: Normocephalic and atraumatic.  ?   Mouth/Throat:  ?   Mouth: Mucous membranes are moist.  ?   Pharynx: Oropharynx is clear.  ?Eyes:  ?   General: No scleral icterus. ?   Pupils: Pupils are equal,  round, and reactive to light.  ?Cardiovascular:  ?   Rate and Rhythm: Normal rate and regular rhythm.  ?   Pulses: Normal pulses.  ?   Heart sounds: No murmur heard. ?  No friction rub. No gallop.  ?Pulmonary:  ?   Effort: Pulmonary effort is normal. No respiratory distress.  ?   Breath sounds: Normal breath sounds. No stridor.  ?Abdominal:  ?   General: Abdomen is flat.  ?Musculoskeletal:     ?   General: No swelling.  ?Skin: ?   General: Skin is warm.  ?Neurological:  ?   General: No focal deficit present.  ?   Mental Status: She is alert and oriented to person, place, and time. Mental status is at baseline.  ?Psychiatric:     ?   Mood and Affect: Mood normal.     ?   Thought Content: Thought content normal.     ?   Judgment: Judgment normal.  ?  ?  ?  ?  ?  ?Assessment and Plan: ?   ?Diagnoses and all orders for this visit: ?  ?Diverticulitis ?  ?  ?  ?1.  We will communicate with Dr. Erlinda Hong office to proceed with colonoscopy prior to scheduling surgery. ?2.  Patient be an excellent candidate for laparoscopic sigmoid colon resection for diverticulitis. ?2.  Discussed with the risk and benefits of the procedure to include management to: Infection, bleeding, damage management, possible leak, possible need for ostomy.  Patient voiced understanding wishes to proceed. ?  ?No follow-ups on file. ?Ralene Ok, MD  ? ? ?

## 2021-07-28 ENCOUNTER — Other Ambulatory Visit: Payer: Self-pay | Admitting: Family Medicine

## 2021-07-28 DIAGNOSIS — R9341 Abnormal radiologic findings on diagnostic imaging of renal pelvis, ureter, or bladder: Secondary | ICD-10-CM

## 2021-07-29 ENCOUNTER — Ambulatory Visit
Admission: RE | Admit: 2021-07-29 | Discharge: 2021-07-29 | Disposition: A | Discharge status: Home / Self Care | Payer: BC Managed Care – PPO | Source: Ambulatory Visit | Attending: Family Medicine | Admitting: Family Medicine

## 2021-07-29 DIAGNOSIS — R9341 Abnormal radiologic findings on diagnostic imaging of renal pelvis, ureter, or bladder: Secondary | ICD-10-CM

## 2021-07-29 DIAGNOSIS — N888 Other specified noninflammatory disorders of cervix uteri: Secondary | ICD-10-CM | POA: Diagnosis not present

## 2021-08-08 DIAGNOSIS — Z8349 Family history of other endocrine, nutritional and metabolic diseases: Secondary | ICD-10-CM | POA: Diagnosis not present

## 2021-08-08 DIAGNOSIS — Z832 Family history of diseases of the blood and blood-forming organs and certain disorders involving the immune mechanism: Secondary | ICD-10-CM | POA: Diagnosis not present

## 2021-10-18 HISTORY — PX: COLONOSCOPY: SHX174

## 2021-10-22 DIAGNOSIS — D120 Benign neoplasm of cecum: Secondary | ICD-10-CM | POA: Diagnosis not present

## 2021-10-22 DIAGNOSIS — D122 Benign neoplasm of ascending colon: Secondary | ICD-10-CM | POA: Diagnosis not present

## 2021-10-22 DIAGNOSIS — K648 Other hemorrhoids: Secondary | ICD-10-CM | POA: Diagnosis not present

## 2021-10-22 DIAGNOSIS — K5732 Diverticulitis of large intestine without perforation or abscess without bleeding: Secondary | ICD-10-CM | POA: Diagnosis not present

## 2021-12-29 NOTE — Progress Notes (Signed)
Surgical Instructions    Your procedure is scheduled on 01/08/22.  Report to [facility] Main Entrance "A" at 8:00 A.M., then check in with the Admitting office.  Call this number if you have problems the morning of surgery:  [phone]   If you have any questions prior to your surgery date call [phone]: Open Monday-Friday 8am-4pm    Remember:  Do not eat after midnight the night before your surgery  You may drink clear liquids until 7:00am the morning of your surgery.   Clear liquids allowed are: Water, Non-Citrus Juices (without pulp), Carbonated Beverages, Clear Tea, Black Coffee ONLY (NO MILK, CREAM OR POWDERED CREAMER of any kind), and Gatorade    Take these medicines the morning of surgery with A SIP OF WATER:  amLODipine (NORVASC) levothyroxine (SYNTHROID)  IF NEEDED: acetaminophen (TYLENOL)  As of today, STOP taking any Aspirin (unless otherwise instructed by your surgeon) Aleve, Naproxen, Ibuprofen, Motrin, Advil, Goody's, BC's, all herbal medications, fish oil, and all vitamins.           Do not wear jewelry or makeup. Do not wear lotions, powders, perfumes/cologne or deodorant. Do not shave 48 hours prior to surgery.   Do not bring valuables to the hospital. Do not wear nail polish, gel polish, artificial nails, or any other type of covering on natural nails (fingers and toes) If you have artificial nails or gel coating that need to be removed by a nail salon, please have this removed prior to surgery. Artificial nails or gel coating may interfere with anesthesia's ability to adequately monitor your vital signs.  [facility] is not responsible for any belongings or valuables.    Do NOT Smoke (Tobacco/Vaping)  24 hours prior to your procedure  If you use a CPAP at night, you may bring your mask for your overnight stay.   Contacts, glasses, hearing aids, dentures or partials may not be worn into surgery, please bring cases for these belongings   For patients  admitted to the hospital, discharge time will be determined by your treatment team.   Patients discharged the day of surgery will not be allowed to drive home, and someone needs to stay with them for 24 hours.   SURGICAL WAITING ROOM VISITATION Patients having surgery or a procedure may have no more than 2 support people in the waiting area - these visitors may rotate.   Children under the age of 63 must have an adult with them who is not the patient. If the patient needs to stay at the hospital during part of their recovery, the visitor guidelines for inpatient rooms apply. Pre-op nurse will coordinate an appropriate time for 1 support person to accompany patient in pre-op.  This support person may not rotate.   Please refer to the [facility] website for the visitor guidelines for Inpatients (after your surgery is over and you are in a regular room).    Special instructions:    Oral Hygiene is also important to reduce your risk of infection.  Remember - BRUSH YOUR TEETH THE MORNING OF SURGERY WITH YOUR REGULAR TOOTHPASTE   [facility]- Preparing For Surgery  Before surgery, you can play an important role. Because skin is not sterile, your skin needs to be as free of germs as possible. You can reduce the number of germs on your skin by washing with CHG (chlorahexidine gluconate) Soap before surgery.  CHG is an antiseptic cleaner which kills germs and bonds with the skin to continue killing germs even  after washing.     Please do not use if you have an allergy to CHG or antibacterial soaps. If your skin becomes reddened/irritated stop using the CHG.  Do not shave (including legs and underarms) for at least 48 hours prior to first CHG shower. It is OK to shave your face.  Please follow these instructions carefully.     Shower the NIGHT BEFORE SURGERY and the MORNING OF SURGERY with CHG Soap.   If you chose to wash your hair, wash your hair first as usual with your normal shampoo. After  you shampoo, rinse your hair and body thoroughly to remove the shampoo.  Then [employer] and genitals (private parts) with your normal soap and rinse thoroughly to remove soap.  After that Use CHG Soap as you would any other liquid soap. You can apply CHG directly to the skin and wash gently with a scrungie or a clean washcloth.   Apply the CHG Soap to your body ONLY FROM THE NECK DOWN.  Do not use on open wounds or open sores. Avoid contact with your eyes, ears, mouth and genitals (private parts). Wash Face and genitals (private parts)  with your normal soap.   Wash thoroughly, paying special attention to the area where your surgery will be performed.  Thoroughly rinse your body with warm water from the neck down.  DO NOT shower/wash with your normal soap after using and rinsing off the CHG Soap.  Pat yourself dry with a CLEAN TOWEL.  Wear CLEAN PAJAMAS to bed the night before surgery  Place CLEAN SHEETS on your bed the night before your surgery  DO NOT SLEEP WITH PETS.   Day of Surgery: Take a shower with CHG soap. Wear Clean/Comfortable clothing the morning of surgery Do not apply any deodorants/lotions.   Remember to brush your teeth WITH YOUR REGULAR TOOTHPASTE.    If you received a COVID test during your pre-op visit, it is requested that you wear a mask when out in public, stay away from anyone that may not be feeling well, and notify your surgeon if you develop symptoms. If you have been in contact with anyone that has tested positive in the last 10 days, please notify your surgeon.    Please read over the following fact sheets that you were given.

## 2021-12-30 ENCOUNTER — Other Ambulatory Visit: Payer: Self-pay

## 2021-12-30 ENCOUNTER — Encounter (HOSPITAL_COMMUNITY)
Admission: RE | Admit: 2021-12-30 | Discharge: 2021-12-30 | Disposition: A | Discharge status: Home / Self Care | Payer: BC Managed Care – PPO | Source: Ambulatory Visit | Attending: General Surgery | Admitting: General Surgery

## 2021-12-30 ENCOUNTER — Encounter (HOSPITAL_COMMUNITY): Payer: Self-pay

## 2021-12-30 VITALS — BP 145/91 | HR 80 | Temp 98.9°F | Resp 17 | Ht 65.0 in | Wt 270.0 lb

## 2021-12-30 DIAGNOSIS — I2510 Atherosclerotic heart disease of native coronary artery without angina pectoris: Secondary | ICD-10-CM | POA: Diagnosis not present

## 2021-12-30 DIAGNOSIS — Z01818 Encounter for other preprocedural examination: Secondary | ICD-10-CM | POA: Diagnosis not present

## 2021-12-30 HISTORY — DX: Thyrotoxicosis with diffuse goiter without thyrotoxic crisis or storm: E05.00

## 2021-12-30 HISTORY — DX: Fibromyalgia: M79.7

## 2021-12-30 HISTORY — DX: Dyspnea, unspecified: R06.00

## 2021-12-30 HISTORY — DX: Essential (primary) hypertension: I10

## 2021-12-30 HISTORY — DX: Hypothyroidism, unspecified: E03.9

## 2021-12-30 HISTORY — DX: Headache, unspecified: R51.9

## 2021-12-30 LAB — BASIC METABOLIC PANEL
Anion gap: 7 (ref 5–15)
BUN: 8 mg/dL (ref 6–20)
CO2: 26 mmol/L (ref 22–32)
Calcium: 8.9 mg/dL (ref 8.9–10.3)
Chloride: 105 mmol/L (ref 98–111)
Creatinine, Ser: 0.93 mg/dL (ref 0.44–1.00)
GFR, Estimated: >60 mL/min (ref >60)
Glucose, Bld: 95 mg/dL (ref 70–99)
Potassium: 3.4 mmol/L — ABNORMAL LOW (ref 3.5–5.1)
Sodium: 138 mmol/L (ref 135–145)

## 2021-12-30 LAB — CBC
HCT: 42.3 % (ref 36.0–46.0)
Hemoglobin: 14.5 g/dL (ref 12.0–15.0)
MCH: 31.6 pg (ref 26.0–34.0)
MCHC: 34.3 g/dL (ref 30.0–36.0)
MCV: 92.2 fL (ref 80.0–100.0)
Platelets: 383 10*3/uL (ref 150–400)
RBC: 4.59 MIL/uL (ref 3.87–5.11)
RDW: 12.8 % (ref 11.5–15.5)
WBC: 6.7 10*3/uL (ref 4.0–10.5)
nRBC: 0 % (ref 0.0–0.2)

## 2021-12-30 LAB — TYPE AND SCREEN
ABO/RH(D): AB POS
Antibody Screen: NEGATIVE

## 2021-12-30 NOTE — Progress Notes (Signed)
PCP - Horton Marshall, PA Cardiologist - Denies  PPM/ICD - Denies Device Orders -  Rep Notified -   Chest x-ray - Denies EKG - 12/30/2021 Stress Test - Denies ECHO - Denies Cardiac Cath - Denies  Sleep Study - Denies CPAP - n/a  No DM  Blood Thinner Instructions: n/a Aspirin Instructions: n/a  NPO after midnight per instructions she received from surgeons office  COVID TEST- n/a   Anesthesia review: No.  Patient denies shortness of breath, fever, cough and chest pain at PAT appointment   All instructions explained to the patient, with a verbal understanding of the material. Patient agrees to go over the instructions while at home for a better understanding. Patient also instructed to self quarantine after being tested for COVID-19. The opportunity to ask questions was provided.

## 2022-01-08 ENCOUNTER — Encounter (HOSPITAL_COMMUNITY)
Admission: RE | Disposition: A | Discharge status: Home / Self Care | Payer: Self-pay | Source: Home / Self Care | Attending: General Surgery

## 2022-01-08 ENCOUNTER — Other Ambulatory Visit: Payer: Self-pay

## 2022-01-08 ENCOUNTER — Inpatient Hospital Stay (HOSPITAL_COMMUNITY)
Admission: RE | Admit: 2022-01-08 | Discharge: 2022-01-12 | DRG: 330 | Disposition: A | Discharge status: Home / Self Care | Payer: BC Managed Care – PPO | Attending: General Surgery | Admitting: General Surgery

## 2022-01-08 ENCOUNTER — Inpatient Hospital Stay (HOSPITAL_COMMUNITY): Payer: BC Managed Care – PPO | Admitting: Anesthesiology

## 2022-01-08 ENCOUNTER — Encounter (HOSPITAL_COMMUNITY): Payer: Self-pay | Admitting: General Surgery

## 2022-01-08 DIAGNOSIS — Z91040 Latex allergy status: Secondary | ICD-10-CM | POA: Diagnosis not present

## 2022-01-08 DIAGNOSIS — K5730 Diverticulosis of large intestine without perforation or abscess without bleeding: Secondary | ICD-10-CM | POA: Diagnosis not present

## 2022-01-08 DIAGNOSIS — M797 Fibromyalgia: Secondary | ICD-10-CM | POA: Diagnosis present

## 2022-01-08 DIAGNOSIS — Z79890 Hormone replacement therapy (postmenopausal): Secondary | ICD-10-CM

## 2022-01-08 DIAGNOSIS — E039 Hypothyroidism, unspecified: Secondary | ICD-10-CM | POA: Diagnosis not present

## 2022-01-08 DIAGNOSIS — Z6841 Body Mass Index (BMI) 40.0 and over, adult: Secondary | ICD-10-CM

## 2022-01-08 DIAGNOSIS — Z8249 Family history of ischemic heart disease and other diseases of the circulatory system: Secondary | ICD-10-CM

## 2022-01-08 DIAGNOSIS — Z885 Allergy status to narcotic agent status: Secondary | ICD-10-CM | POA: Diagnosis not present

## 2022-01-08 DIAGNOSIS — K5732 Diverticulitis of large intestine without perforation or abscess without bleeding: Secondary | ICD-10-CM | POA: Diagnosis not present

## 2022-01-08 DIAGNOSIS — K5792 Diverticulitis of intestine, part unspecified, without perforation or abscess without bleeding: Secondary | ICD-10-CM | POA: Diagnosis not present

## 2022-01-08 DIAGNOSIS — Z9049 Acquired absence of other specified parts of digestive tract: Secondary | ICD-10-CM

## 2022-01-08 DIAGNOSIS — I10 Essential (primary) hypertension: Secondary | ICD-10-CM | POA: Diagnosis present

## 2022-01-08 DIAGNOSIS — Z79899 Other long term (current) drug therapy: Secondary | ICD-10-CM

## 2022-01-08 HISTORY — PX: LAPAROSCOPIC SIGMOID COLECTOMY: SHX5928

## 2022-01-08 LAB — POCT PREGNANCY, URINE: Preg Test, Ur: NEGATIVE

## 2022-01-08 LAB — ABO/RH: ABO/RH(D): AB POS

## 2022-01-08 SURGERY — COLECTOMY, SIGMOID, LAPAROSCOPIC
Anesthesia: General

## 2022-01-08 MED ORDER — HYDROMORPHONE HCL 1 MG/ML IJ SOLN
0.2500 mg | INTRAMUSCULAR | Status: DC | PRN
  Administered 2022-01-08 (×3): 0.5 mg via INTRAVENOUS

## 2022-01-08 MED ORDER — AMISULPRIDE (ANTIEMETIC) 5 MG/2ML IV SOLN: 10.0000 mg | Freq: Once | INTRAVENOUS | Status: DC | PRN

## 2022-01-08 MED ORDER — KETAMINE HCL 50 MG/5ML IJ SOSY
PREFILLED_SYRINGE | INTRAMUSCULAR | Status: AC
  Filled 2022-01-08: qty 5

## 2022-01-08 MED ORDER — CHLORHEXIDINE GLUCONATE 0.12 % MT SOLN
15.0000 mL | Freq: Once | OROMUCOSAL | Status: AC
  Administered 2022-01-08: 15 mL via OROMUCOSAL
  Filled 2022-01-08: qty 15

## 2022-01-08 MED ORDER — ALBUMIN HUMAN 5 % IV SOLN: INTRAVENOUS | Status: DC | PRN

## 2022-01-08 MED ORDER — MEPERIDINE HCL 25 MG/ML IJ SOLN: 6.2500 mg | INTRAMUSCULAR | Status: DC | PRN

## 2022-01-08 MED ORDER — HYDROMORPHONE HCL 1 MG/ML IJ SOLN
INTRAMUSCULAR | Status: AC
  Filled 2022-01-08: qty 0.5

## 2022-01-08 MED ORDER — ONDANSETRON HCL 4 MG/2ML IJ SOLN
INTRAMUSCULAR | Status: DC | PRN
  Administered 2022-01-08: 4 mg via INTRAVENOUS

## 2022-01-08 MED ORDER — ONDANSETRON HCL 4 MG/2ML IJ SOLN
INTRAMUSCULAR | Status: AC
  Filled 2022-01-08: qty 2

## 2022-01-08 MED ORDER — FENTANYL CITRATE (PF) 250 MCG/5ML IJ SOLN
INTRAMUSCULAR | Status: AC
  Filled 2022-01-08: qty 5

## 2022-01-08 MED ORDER — METRONIDAZOLE 500 MG/100ML IV SOLN
INTRAVENOUS | Status: DC | PRN
  Administered 2022-01-08: 500 mg via INTRAVENOUS

## 2022-01-08 MED ORDER — ENSURE SURGERY PO LIQD
237.0000 mL | Freq: Two times a day (BID) | ORAL | Status: DC
  Administered 2022-01-09 – 2022-01-12 (×6): 237 mL via ORAL
  Filled 2022-01-08 (×9): qty 237

## 2022-01-08 MED ORDER — CEFAZOLIN SODIUM-DEXTROSE 2-3 GM-%(50ML) IV SOLR
INTRAVENOUS | Status: DC | PRN
  Administered 2022-01-08: 2 g via INTRAVENOUS

## 2022-01-08 MED ORDER — LIDOCAINE 2% (20 MG/ML) 5 ML SYRINGE
INTRAMUSCULAR | Status: AC
  Filled 2022-01-08: qty 5

## 2022-01-08 MED ORDER — ONDANSETRON HCL 4 MG PO TABS: 4.0000 mg | ORAL_TABLET | Freq: Four times a day (QID) | ORAL | Status: DC | PRN

## 2022-01-08 MED ORDER — HYDROMORPHONE HCL 1 MG/ML IJ SOLN
1.0000 mg | INTRAMUSCULAR | Status: DC | PRN
  Administered 2022-01-08 – 2022-01-12 (×10): 1 mg via INTRAVENOUS
  Filled 2022-01-08 (×11): qty 1

## 2022-01-08 MED ORDER — LIDOCAINE 2% (20 MG/ML) 5 ML SYRINGE
INTRAMUSCULAR | Status: DC | PRN
  Administered 2022-01-08: 80 mg via INTRAVENOUS

## 2022-01-08 MED ORDER — BUPIVACAINE LIPOSOME 1.3 % IJ SUSP
INTRAMUSCULAR | Status: AC
  Filled 2022-01-08: qty 20

## 2022-01-08 MED ORDER — ONDANSETRON HCL 4 MG/2ML IJ SOLN
4.0000 mg | Freq: Four times a day (QID) | INTRAMUSCULAR | Status: DC | PRN
  Administered 2022-01-08 – 2022-01-09 (×2): 4 mg via INTRAVENOUS
  Filled 2022-01-08 (×2): qty 2

## 2022-01-08 MED ORDER — KETOROLAC TROMETHAMINE 30 MG/ML IJ SOLN
30.0000 mg | Freq: Four times a day (QID) | INTRAMUSCULAR | Status: DC | PRN
  Administered 2022-01-08 (×2): 30 mg via INTRAVENOUS
  Filled 2022-01-08 (×2): qty 1

## 2022-01-08 MED ORDER — SODIUM CHLORIDE 0.9 % IR SOLN
Status: DC | PRN
  Administered 2022-01-08: 2000 mL
  Administered 2022-01-08: 1000 mL

## 2022-01-08 MED ORDER — STERILE WATER FOR IRRIGATION IR SOLN
Status: DC | PRN
  Administered 2022-01-08: 1000 mL

## 2022-01-08 MED ORDER — PROMETHAZINE HCL 25 MG/ML IJ SOLN: 6.2500 mg | INTRAMUSCULAR | Status: DC | PRN

## 2022-01-08 MED ORDER — LEVOTHYROXINE SODIUM 75 MCG PO TABS
175.0000 ug | ORAL_TABLET | Freq: Every morning | ORAL | Status: DC
  Administered 2022-01-09 – 2022-01-12 (×4): 175 ug via ORAL
  Filled 2022-01-08 (×4): qty 1

## 2022-01-08 MED ORDER — PROPOFOL 10 MG/ML IV BOLUS
INTRAVENOUS | Status: AC
  Filled 2022-01-08: qty 20

## 2022-01-08 MED ORDER — BUPIVACAINE-EPINEPHRINE 0.25% -1:200000 IJ SOLN
INTRAMUSCULAR | Status: DC | PRN
  Administered 2022-01-08: 5 mL

## 2022-01-08 MED ORDER — MIDAZOLAM HCL 2 MG/2ML IJ SOLN
INTRAMUSCULAR | Status: DC | PRN
  Administered 2022-01-08: 2 mg via INTRAVENOUS

## 2022-01-08 MED ORDER — KETAMINE HCL 10 MG/ML IJ SOLN
INTRAMUSCULAR | Status: DC | PRN
  Administered 2022-01-08: 10 mg via INTRAVENOUS
  Administered 2022-01-08: 30 mg via INTRAVENOUS
  Administered 2022-01-08: 10 mg via INTRAVENOUS

## 2022-01-08 MED ORDER — LACTATED RINGERS IV SOLN: INTRAVENOUS | Status: DC

## 2022-01-08 MED ORDER — OXYCODONE HCL 5 MG PO TABS: 5.0000 mg | ORAL_TABLET | Freq: Once | ORAL | Status: DC | PRN

## 2022-01-08 MED ORDER — SODIUM CHLORIDE 0.9 % IV SOLN
INTRAVENOUS | Status: DC | PRN
  Administered 2022-01-08: 30 mL

## 2022-01-08 MED ORDER — ENOXAPARIN SODIUM 60 MG/0.6ML IJ SOSY
60.0000 mg | PREFILLED_SYRINGE | Freq: Every day | INTRAMUSCULAR | Status: DC
  Administered 2022-01-09: 60 mg via SUBCUTANEOUS
  Filled 2022-01-08: qty 0.6

## 2022-01-08 MED ORDER — SODIUM CHLORIDE 0.9 % IV SOLN
2.0000 g | Freq: Two times a day (BID) | INTRAVENOUS | Status: AC
  Administered 2022-01-08: 2 g via INTRAVENOUS
  Filled 2022-01-08: qty 2

## 2022-01-08 MED ORDER — PHENYLEPHRINE 80 MCG/ML (10ML) SYRINGE FOR IV PUSH (FOR BLOOD PRESSURE SUPPORT)
PREFILLED_SYRINGE | INTRAVENOUS | Status: DC | PRN
  Administered 2022-01-08 (×2): 80 ug via INTRAVENOUS

## 2022-01-08 MED ORDER — SUGAMMADEX SODIUM 200 MG/2ML IV SOLN
INTRAVENOUS | Status: DC | PRN
  Administered 2022-01-08: 400 mg via INTRAVENOUS

## 2022-01-08 MED ORDER — EPHEDRINE 5 MG/ML INJ
INTRAVENOUS | Status: AC
  Filled 2022-01-08: qty 5

## 2022-01-08 MED ORDER — HYDROMORPHONE HCL 1 MG/ML IJ SOLN
INTRAMUSCULAR | Status: AC
  Filled 2022-01-08: qty 2

## 2022-01-08 MED ORDER — PHENYLEPHRINE HCL-NACL 20-0.9 MG/250ML-% IV SOLN: INTRAVENOUS | Status: DC | PRN

## 2022-01-08 MED ORDER — ROCURONIUM BROMIDE 10 MG/ML (PF) SYRINGE
PREFILLED_SYRINGE | INTRAVENOUS | Status: DC | PRN
  Administered 2022-01-08: 100 mg via INTRAVENOUS
  Administered 2022-01-08: 20 mg via INTRAVENOUS

## 2022-01-08 MED ORDER — PHENYLEPHRINE 80 MCG/ML (10ML) SYRINGE FOR IV PUSH (FOR BLOOD PRESSURE SUPPORT)
PREFILLED_SYRINGE | INTRAVENOUS | Status: AC
  Filled 2022-01-08: qty 10

## 2022-01-08 MED ORDER — ORAL CARE MOUTH RINSE: 15.0000 mL | Freq: Once | OROMUCOSAL | Status: AC

## 2022-01-08 MED ORDER — IRBESARTAN 300 MG PO TABS
300.0000 mg | ORAL_TABLET | Freq: Every day | ORAL | Status: DC
  Administered 2022-01-09 – 2022-01-12 (×4): 300 mg via ORAL
  Filled 2022-01-08 (×5): qty 1

## 2022-01-08 MED ORDER — FENTANYL CITRATE (PF) 250 MCG/5ML IJ SOLN
INTRAMUSCULAR | Status: DC | PRN
  Administered 2022-01-08 (×5): 50 ug via INTRAVENOUS

## 2022-01-08 MED ORDER — PROPOFOL 10 MG/ML IV BOLUS
INTRAVENOUS | Status: DC | PRN
  Administered 2022-01-08: 20 mg via INTRAVENOUS
  Administered 2022-01-08: 200 mg via INTRAVENOUS
  Administered 2022-01-08: 20 mg via INTRAVENOUS

## 2022-01-08 MED ORDER — KETOROLAC TROMETHAMINE 15 MG/ML IJ SOLN
INTRAMUSCULAR | Status: DC | PRN
  Administered 2022-01-08: 15 mg via INTRAVENOUS

## 2022-01-08 MED ORDER — OXYCODONE HCL 5 MG/5ML PO SOLN: 5.0000 mg | Freq: Once | ORAL | Status: DC | PRN

## 2022-01-08 MED ORDER — AMLODIPINE BESYLATE 5 MG PO TABS
5.0000 mg | ORAL_TABLET | Freq: Every day | ORAL | Status: DC
  Administered 2022-01-09 – 2022-01-12 (×4): 5 mg via ORAL
  Filled 2022-01-08 (×4): qty 1

## 2022-01-08 MED ORDER — DEXAMETHASONE SODIUM PHOSPHATE 10 MG/ML IJ SOLN
INTRAMUSCULAR | Status: AC
  Filled 2022-01-08: qty 1

## 2022-01-08 MED ORDER — SCOPOLAMINE 1 MG/3DAYS TD PT72
MEDICATED_PATCH | TRANSDERMAL | Status: DC | PRN
  Administered 2022-01-08: 1 via TRANSDERMAL

## 2022-01-08 MED ORDER — CIPROFLOXACIN IN D5W 400 MG/200ML IV SOLN
400.0000 mg | Freq: Once | INTRAVENOUS | Status: AC
  Administered 2022-01-08: 400 mg via INTRAVENOUS
  Filled 2022-01-08: qty 200

## 2022-01-08 MED ORDER — ENOXAPARIN SODIUM 40 MG/0.4ML IJ SOSY: 40.0000 mg | PREFILLED_SYRINGE | INTRAMUSCULAR | Status: DC

## 2022-01-08 MED ORDER — ACETAMINOPHEN 10 MG/ML IV SOLN
INTRAVENOUS | Status: DC | PRN
  Administered 2022-01-08: 1000 mg via INTRAVENOUS

## 2022-01-08 MED ORDER — SACCHAROMYCES BOULARDII 250 MG PO CAPS
250.0000 mg | ORAL_CAPSULE | Freq: Two times a day (BID) | ORAL | Status: DC
  Administered 2022-01-08 – 2022-01-12 (×9): 250 mg via ORAL
  Filled 2022-01-08 (×9): qty 1

## 2022-01-08 MED ORDER — ACETAMINOPHEN 500 MG PO TABS
1000.0000 mg | ORAL_TABLET | Freq: Four times a day (QID) | ORAL | Status: DC
  Administered 2022-01-08 (×2): 1000 mg via ORAL
  Filled 2022-01-08 (×2): qty 2

## 2022-01-08 MED ORDER — ROCURONIUM BROMIDE 10 MG/ML (PF) SYRINGE
PREFILLED_SYRINGE | INTRAVENOUS | Status: AC
  Filled 2022-01-08: qty 10

## 2022-01-08 MED ORDER — DEXAMETHASONE SODIUM PHOSPHATE 10 MG/ML IJ SOLN
INTRAMUSCULAR | Status: DC | PRN
  Administered 2022-01-08: 10 mg via INTRAVENOUS

## 2022-01-08 MED ORDER — METRONIDAZOLE 500 MG/100ML IV SOLN
500.0000 mg | Freq: Once | INTRAVENOUS | Status: AC
  Administered 2022-01-08: 500 mg via INTRAVENOUS
  Filled 2022-01-08 (×2): qty 100

## 2022-01-08 MED ORDER — SUGAMMADEX SODIUM 200 MG/2ML IV SOLN: INTRAVENOUS | Status: DC | PRN

## 2022-01-08 MED ORDER — HYDROMORPHONE HCL 1 MG/ML IJ SOLN
INTRAMUSCULAR | Status: DC | PRN
  Administered 2022-01-08: .5 mg via INTRAVENOUS

## 2022-01-08 MED ORDER — DEXTROSE-NACL 5-0.9 % IV SOLN: INTRAVENOUS | Status: DC

## 2022-01-08 MED ORDER — OXYCODONE-ACETAMINOPHEN 5-325 MG PO TABS
1.0000 | ORAL_TABLET | ORAL | Status: DC | PRN
  Administered 2022-01-08 – 2022-01-09 (×4): 2 via ORAL
  Administered 2022-01-09: 1 via ORAL
  Administered 2022-01-09 – 2022-01-12 (×9): 2 via ORAL
  Filled 2022-01-08: qty 2
  Filled 2022-01-08: qty 1
  Filled 2022-01-08 (×2): qty 2
  Filled 2022-01-08: qty 1
  Filled 2022-01-08 (×5): qty 2
  Filled 2022-01-08: qty 1
  Filled 2022-01-08 (×4): qty 2

## 2022-01-08 MED ORDER — ACETAMINOPHEN 10 MG/ML IV SOLN
INTRAVENOUS | Status: AC
  Filled 2022-01-08: qty 100

## 2022-01-08 MED ORDER — MIDAZOLAM HCL 2 MG/2ML IJ SOLN
INTRAMUSCULAR | Status: AC
  Filled 2022-01-08: qty 2

## 2022-01-08 SURGICAL SUPPLY — 94 items
APPLIER CLIP 5 13 M/L LIGAMAX5 (MISCELLANEOUS)
APPLIER CLIP ROT 10 11.4 M/L (STAPLE)
BAG COUNTER SPONGE SURGICOUNT (BAG) ×1 IMPLANT
BLADE CLIPPER SURG (BLADE) IMPLANT
CANISTER SUCT 3000ML PPV (MISCELLANEOUS) ×1 IMPLANT
CELLS DAT CNTRL 66122 CELL SVR (MISCELLANEOUS) ×1 IMPLANT
CHLORAPREP W/TINT 26 (MISCELLANEOUS) ×1 IMPLANT
CLIP APPLIE 5 13 M/L LIGAMAX5 (MISCELLANEOUS) IMPLANT
CLIP APPLIE ROT 10 11.4 M/L (STAPLE) IMPLANT
COVER MAYO STAND STRL (DRAPES) ×2 IMPLANT
COVER SURGICAL LIGHT HANDLE (MISCELLANEOUS) ×2 IMPLANT
DEFOGGER SCOPE WARMER CLEARIFY (MISCELLANEOUS) IMPLANT
DRAPE HALF SHEET 40X57 (DRAPES) ×1 IMPLANT
DRAPE UTILITY XL STRL (DRAPES) ×1 IMPLANT
DRAPE WARM FLUID 44X44 (DRAPES) ×1 IMPLANT
DRSG COVADERM PLUS 2X2 (GAUZE/BANDAGES/DRESSINGS) IMPLANT
DRSG OPSITE POSTOP 4X10 (GAUZE/BANDAGES/DRESSINGS) IMPLANT
DRSG OPSITE POSTOP 4X6 (GAUZE/BANDAGES/DRESSINGS) IMPLANT
DRSG OPSITE POSTOP 4X8 (GAUZE/BANDAGES/DRESSINGS) IMPLANT
ELECT BLADE 6.5 EXT (BLADE) ×1 IMPLANT
ELECT CAUTERY BLADE 6.4 (BLADE) ×1 IMPLANT
ELECT PENCIL ROCKER SW 15FT (MISCELLANEOUS) IMPLANT
ELECT REM PT RETURN 9FT ADLT (ELECTROSURGICAL) ×1
ELECTRODE REM PT RTRN 9FT ADLT (ELECTROSURGICAL) ×1 IMPLANT
GEL ULTRASOUND 20GR AQUASONIC (MISCELLANEOUS) IMPLANT
GLOVE BIO SURGEON STRL SZ7.5 (GLOVE) ×2 IMPLANT
GLOVE BIOGEL PI IND STRL 8 (GLOVE) ×2 IMPLANT
GLOVE SURG SYN 7.5  E (GLOVE) ×2
GLOVE SURG SYN 7.5 E (GLOVE) ×2 IMPLANT
GLOVE SURG SYN 7.5 PF PI (GLOVE) ×2 IMPLANT
GOWN STRL REUS W/ TWL LRG LVL3 (GOWN DISPOSABLE) ×6 IMPLANT
GOWN STRL REUS W/ TWL XL LVL3 (GOWN DISPOSABLE) ×2 IMPLANT
GOWN STRL REUS W/TWL LRG LVL3 (GOWN DISPOSABLE) ×6
GOWN STRL REUS W/TWL XL LVL3 (GOWN DISPOSABLE) ×2
GRASPER SUT TROCAR 14GX15 (MISCELLANEOUS) IMPLANT
HANDLE SUCTION POOLE (INSTRUMENTS) ×1 IMPLANT
KIT BASIN OR (CUSTOM PROCEDURE TRAY) ×1 IMPLANT
KIT SIGMOIDOSCOPE (SET/KITS/TRAYS/PACK) ×1 IMPLANT
LEGGING LITHOTOMY PAIR STRL (DRAPES) ×1 IMPLANT
NDL INSUFFLATION 14GA 120MM (NEEDLE) IMPLANT
NEEDLE HYPO 22GX1.5 SAFETY (NEEDLE) IMPLANT
NEEDLE INSUFFLATION 14GA 120MM (NEEDLE) ×1 IMPLANT
NS IRRIG 1000ML POUR BTL (IV SOLUTION) ×2 IMPLANT
PAD ARMBOARD 7.5X6 YLW CONV (MISCELLANEOUS) ×2 IMPLANT
PENCIL SMOKE EVACUATOR (MISCELLANEOUS) ×2 IMPLANT
POUCH LAPAROSCOPIC INSTRUMENT (MISCELLANEOUS) ×1 IMPLANT
RELOAD STAPLE 60 3.6 BLU REG (STAPLE) IMPLANT
RELOAD STAPLER BLUE 60MM (STAPLE) ×3 IMPLANT
RETRACTOR WND ALEXIS 18 MED (MISCELLANEOUS) IMPLANT
RTRCTR WOUND ALEXIS 18CM MED (MISCELLANEOUS) ×1
SCISSORS LAP 5X35 DISP (ENDOMECHANICALS) ×1 IMPLANT
SET IRRIG TUBING LAPAROSCOPIC (IRRIGATION / IRRIGATOR) IMPLANT
SET TUBE SMOKE EVAC HIGH FLOW (TUBING) ×1 IMPLANT
SHEARS HARMONIC ACE PLUS 36CM (ENDOMECHANICALS) ×1 IMPLANT
SLEEVE ENDOPATH XCEL 5M (ENDOMECHANICALS) ×1 IMPLANT
SLEEVE Z-THREAD 5X100MM (TROCAR) IMPLANT
SPECIMEN JAR LARGE (MISCELLANEOUS) ×1 IMPLANT
SPONGE T-LAP 18X18 ~~LOC~~+RFID (SPONGE) IMPLANT
STAPLE ECHEON FLEX 60 POW ENDO (STAPLE) IMPLANT
STAPLER CIRCULAR MANUAL XL 29 (STAPLE) IMPLANT
STAPLER RELOAD BLUE 60MM (STAPLE) ×3
STAPLER VISISTAT 35W (STAPLE) ×1 IMPLANT
SUCTION POOLE HANDLE (INSTRUMENTS) ×1
SURGILUBE 2OZ TUBE FLIPTOP (MISCELLANEOUS) ×1 IMPLANT
SUT PDS AB 1 CT  36 (SUTURE) ×2
SUT PDS AB 1 CT 36 (SUTURE) IMPLANT
SUT PDS AB 1 TP1 54 (SUTURE) IMPLANT
SUT PDS AB 1 TP1 96 (SUTURE) ×2 IMPLANT
SUT PROLENE 0 CT 1 30 (SUTURE) IMPLANT
SUT PROLENE 2 0 CT2 30 (SUTURE) IMPLANT
SUT PROLENE 2 0 KS (SUTURE) IMPLANT
SUT SILK 2 0 SH CR/8 (SUTURE) ×1 IMPLANT
SUT SILK 2 0 TIES 10X30 (SUTURE) ×1 IMPLANT
SUT SILK 3 0 SH CR/8 (SUTURE) ×1 IMPLANT
SUT SILK 3 0 TIES 10X30 (SUTURE) ×1 IMPLANT
SUT VICRYL 0 UR6 27IN ABS (SUTURE) IMPLANT
SYR 20ML LL LF (SYRINGE) IMPLANT
SYR BULB IRRIG 60ML STRL (SYRINGE) ×1 IMPLANT
SYS LAPSCP GELPORT 120MM (MISCELLANEOUS)
SYS WOUND ALEXIS 18CM MED (MISCELLANEOUS) ×1
SYSTEM LAPSCP GELPORT 120MM (MISCELLANEOUS) IMPLANT
SYSTEM WOUND ALEXIS 18CM MED (MISCELLANEOUS) IMPLANT
TOWEL GREEN STERILE (TOWEL DISPOSABLE) ×2 IMPLANT
TRAY FOLEY MTR SLVR 16FR STAT (SET/KITS/TRAYS/PACK) ×1 IMPLANT
TRAY LAPAROSCOPIC MC (CUSTOM PROCEDURE TRAY) ×1 IMPLANT
TROCAR XCEL 12X100 BLDLESS (ENDOMECHANICALS) IMPLANT
TROCAR XCEL BLUNT TIP 100MML (ENDOMECHANICALS) IMPLANT
TROCAR XCEL NON-BLD 11X100MML (ENDOMECHANICALS) IMPLANT
TROCAR Z-THREAD OPTICAL 5X100M (TROCAR) ×1 IMPLANT
TUBE CONNECTING 12X1/4 (SUCTIONS) ×2 IMPLANT
TUBING EVAC SMOKE HEATED PNEUM (TUBING) ×1 IMPLANT
WARMER LAPAROSCOPE (MISCELLANEOUS) ×1 IMPLANT
WATER STERILE IRR 1000ML POUR (IV SOLUTION) ×1 IMPLANT
YANKAUER SUCT BULB TIP NO VENT (SUCTIONS) ×2 IMPLANT

## 2022-01-08 NOTE — Anesthesia Postprocedure Evaluation (Signed)
Anesthesia Post Note  Patient: Patty Bruce  Procedure(s) Performed: LAPAROSCOPIC SIGMOID COLECTOMY     Patient location during evaluation: PACU Anesthesia Type: General Level of consciousness: awake and alert Pain management: pain level controlled Vital Signs Assessment: post-procedure vital signs reviewed and stable Respiratory status: spontaneous breathing, nonlabored ventilation and respiratory function stable Cardiovascular status: blood pressure returned to baseline and stable Postop Assessment: no apparent nausea or vomiting Anesthetic complications: no   No notable events documented.  Last Vitals:  Vitals:   01/08/22 1409 01/08/22 1438  BP:  100/74  Pulse: 72 74  Resp: (!) 8 16  Temp:  36.5 C  SpO2: 95% 97%    Last Pain:  Vitals:   01/08/22 1438  TempSrc: Oral  PainSc:                  Lynda Rainwater

## 2022-01-08 NOTE — Op Note (Signed)
01/08/2022  1:01 PM  PATIENT:  Patty Bruce  46 y.o. female  PRE-OPERATIVE DIAGNOSIS:  DIVERTICULITIS  POST-OPERATIVE DIAGNOSIS:  DIVERTICULITIS  PROCEDURE:  Procedure(s): LAPAROSCOPIC SIGMOID COLECTOMY (N/A) Splenic flexure mobilization.  SURGEON:  Surgeon(s) and Role:    * Axel Filler, MD - Primary  ASSISTANTS: Coralie Keens, MD, PGY-5-who was essential helping with dissection, retraction and anastomosis of the colon.  ANESTHESIA:   local and general  EBL:  20 mL   BLOOD ADMINISTERED:none  DRAINS: none   LOCAL MEDICATIONS USED:  BUPIVICAINE  and OTHER Exparel  SPECIMEN:  Source of Specimen: Sigmoid colon  DISPOSITION OF SPECIMEN:  PATHOLOGY  COUNTS:  YES  TOURNIQUET:  * No tourniquets in log *  DICTATION: .Dragon Dictation  After the patient was consented she was taken back to the OR and placed in supine position with bilateral SCDs in place.  She underwent general trach intubation.  Patient was then placed in lithotomy position.  A Foley catheter was placed.  Patient was then prepped and draped in standard fashion.  A timeout was called all facts verified.  At this time pneumoperitoneum was obtained via a Veress needle technique in the right upper quadrant.  Subsequent to this a 5 Miller trocar and camera placed intra-abdominal.  There is no injury to any intra-abdominal organs.  At this time a 12 mm trocar was then placed in the right lower quadrant direct visualization.  Second and third 5 Miller trocars were placed in the epigastrium and left upper quadrant direct visualization.  The patient was positioned in Trendelenburg position.  At this time the sigmoid colon was visualized.  It was apparent that there was some adhesion to the left pelvic wall.  Some diverticuli could easily be seen in the sigmoid colon.  At this time the mesentery of the colon was retracted the sigmoid artery was identified.  We proceeded from medial lateral direction.  The left ureter was  identified and protected all parts of the case.  The sigmoidal artery was dissected and cauterized using a LigaSure device.  The mesentery was then dissected inferiorly towards the rectum.  This was done towards the pelvic brim.  At this time we continued the dissection proximally.  A an area was chosen of abnormal looking left colon.  This was just distal to the left colic artery.  At this time the mesentery was ligated using LigaSure device up to the colon.  At this time a laparoscopic stapler was then used to transect the proximal portion of the colon.  The dissection continued up the white line of Toldt.  Splenic flexure was taken down.  The proximal portion of the rectum was also transected using a endoscopic stapler with 2 firings.  At this time a Pfannenstiel incision was made in the lower midline abdomen.  Dissection was taken down to the intra-abdominal wall using cautery.  The anterior fascia was incised.  Flaps were created inferiorly and superiorly.  The midline was found.  This was incised.  The peritoneum was entered bluntly.  At this time the [facility] wound protector was placed.  Specimen was brought out in its entirety.  At this time the proximal portion of the colon was identified.  This was brought up through the [facility] wound protector.  Stapler was removed.  Was able to use a sizers and a 29 mm sizer was chosen for size.  The 29 mm anvil was then placed.  A 0 Prolene was placed at the end as a pursestring  around the anvil.  This was placed into the abdominal cavity.  At this time aside from below were used from a 25 to 29 mm sizer.  At this time the EEA stapler was advanced up to the rectum.  The spike was brought out anteriorly.  The left colon was seen to be without any twist Easily without tension into the pelvis.  Anastomosis was created.  The staple was retracted and removed.  At this time pelvis was filled with saline.  Emergency room anoscopy was undertaken.  Air was introduced the  bubble test was negative.  The pelvis was irrigated out with sterile saline.  At this time the 12 mm trocar site was excised.  A 0 Vicryl using an suture passer was then used to close the fascia.  All trocars removed.  Underwent colon protocol.  At this time the fascial incision was reapproximated in 2 layers the peritoneum was closed using a running 0 PDS.  The anterior fascia was then reapproximated using a running 3-0 PDS x2.  The skin was reapproximated using skin staples in the Pfannenstiel incision and all trocar sites.  Patient tolerated the procedure well was taken to the recovery in stable condition.  PLAN OF CARE: Admit to inpatient   PATIENT DISPOSITION:  PACU - hemodynamically stable.   Delay start of Pharmacological VTE agent (>24hrs) due to surgical blood loss or risk of bleeding: yes

## 2022-01-08 NOTE — Interval H&P Note (Signed)
History and Physical Interval Note:  01/08/2022 9:48 AM  Patty Bruce  has presented today for surgery, with the diagnosis of DIVERTICULITIS.  The various methods of treatment have been discussed with the patient and family. After consideration of risks, benefits and other options for treatment, the patient has consented to  Procedure(s): LAPAROSCOPIC SIGMOID COLECTOMY (N/A) as a surgical intervention.  The patient's history has been reviewed, patient examined, no change in status, stable for surgery.  I have reviewed the patient's chart and labs.  Questions were answered to the patient's satisfaction.     Ralene Ok

## 2022-01-08 NOTE — Transfer of Care (Signed)
Immediate Anesthesia Transfer of Care Note  Patient: Allicia Culley  Procedure(s) Performed: LAPAROSCOPIC SIGMOID COLECTOMY  Patient Location: PACU  Anesthesia Type:General  Level of Consciousness: awake, drowsy and patient cooperative  Airway & Oxygen Therapy: Patient Spontanous Breathing and Patient connected to face mask oxygen  Post-op Assessment: Report given to RN and Post -op Vital signs reviewed and stable  Post vital signs: Reviewed and stable  Last Vitals:  Vitals Value Taken Time  BP 116/72 01/08/22 1330  Temp    Pulse 72 01/08/22 1335  Resp 16 01/08/22 1335  SpO2 96 % 01/08/22 1335  Vitals shown include unvalidated device data.  Last Pain:  Vitals:   01/08/22 0823  TempSrc:   PainSc: 2          Complications: No notable events documented.

## 2022-01-08 NOTE — Anesthesia Procedure Notes (Signed)
Procedure Name: Intubation Date/Time: 01/08/2022 10:39 AM  Performed by: Reeves Dam, CRNAPre-anesthesia Checklist: Patient identified, Patient being monitored, Timeout performed, Emergency Drugs available and Suction available Patient Re-evaluated:Patient Re-evaluated prior to induction Oxygen Delivery Method: Circle system utilized Preoxygenation: Pre-oxygenation with 100% oxygen Induction Type: IV induction Ventilation: Mask ventilation without difficulty Laryngoscope Size: 3 and Miller Grade View: Grade I Tube type: Oral Tube size: 7.5 mm Number of attempts: 1 Airway Equipment and Method: Stylet Placement Confirmation: ETT inserted through vocal cords under direct vision, positive ETCO2 and breath sounds checked- equal and bilateral Secured at: 21 cm Tube secured with: Tape Dental Injury: Teeth and Oropharynx as per pre-operative assessment

## 2022-01-08 NOTE — Progress Notes (Signed)
Pacu RN Report to floor given  Gave report to  [employer]. Room: 6N03  Discussed surgery, meds given in OR and Pacu, VS, IV fluids given, EBL, urine output, pain and other pertinent information. Also discussed if pt had any family or friends here or belongings with them.   Discussed dressings, VSS, pain, updated daughter, belongings being brought to room.   Pt exits my care.

## 2022-01-08 NOTE — Anesthesia Preprocedure Evaluation (Signed)
Anesthesia Evaluation  Patient identified by MRN, date of birth, ID band Patient awake    Reviewed: Allergy & Precautions, NPO status , Patient's Chart, lab work & pertinent test results  Airway Mallampati: II  TM Distance: >3 FB Neck ROM: Full    Dental no notable dental hx.    Pulmonary shortness of breath,    Pulmonary exam normal breath sounds clear to auscultation       Cardiovascular hypertension, Pt. on medications negative cardio ROS Normal cardiovascular exam Rhythm:Regular Rate:Normal     Neuro/Psych  Headaches, negative psych ROS   GI/Hepatic negative GI ROS, Neg liver ROS,   Endo/Other  Hypothyroidism Morbid obesity  Renal/GU negative Renal ROS  negative genitourinary   Musculoskeletal  (+) Fibromyalgia -  Abdominal (+) + obese,   Peds negative pediatric ROS (+)  Hematology negative hematology ROS (+)   Anesthesia Other Findings   Reproductive/Obstetrics negative OB ROS                             Anesthesia Physical Anesthesia Plan  ASA: 3  Anesthesia Plan: General   Post-op Pain Management: Dilaudid IV   Induction: Intravenous  PONV Risk Score and Plan: 3 and Ondansetron, Dexamethasone, Midazolam and Treatment may vary due to age or medical condition  Airway Management Planned: Oral ETT  Additional Equipment:   Intra-op Plan:   Post-operative Plan: Extubation in OR  Informed Consent: I have reviewed the patients History and Physical, chart, labs and discussed the procedure including the risks, benefits and alternatives for the proposed anesthesia with the patient or authorized representative who has indicated his/her understanding and acceptance.     Dental advisory given  Plan Discussed with: CRNA  Anesthesia Plan Comments:         Anesthesia Quick Evaluation

## 2022-01-08 NOTE — H&P (Signed)
Chief Complaint: No chief complaint on file. ?  ?  ?History of Present Illness: ?Patty Bruce is a 45 y.o. female who is seen today as an office consultation for evaluation of No chief complaint on file. ? .   ?  ?Patient is a 45-year-old female, who comes in secondary to a multiple episode history of diverticulitis. ?Patient has a history of hypothyroidism, hypertension. ?  ?Patient states that she had multiple episodes of the last 3 years of diverticulitis.  Patient did have previous colonoscopy which revealed diverticuli.  There were no further findings per the patient. ?  ?Patient's had multiple episodes requiring antibiotics in the recent past she states that she just finished antibiotics and the most recent episode.  She states that she would like to have surgery to help prevent any further episodes. ?  ?She has had previous appendectomy, cholecystectomy, tubal ligation and bladder mesh in the past.  These were laparoscopic. ?  ?Of note I did review the CT scan.  CT scan was significant for diverticulitis, minimal perisigmoid inflammation. ?  ?   ?Review of Systems: ?A complete review of systems was obtained from the patient.  I have reviewed this information and discussed as appropriate with the patient.  See HPI as well for other ROS. ?  ?Review of Systems  ?Constitutional: Negative for fever.  ?HENT: Negative for congestion.   ?Eyes: Negative for blurred vision.  ?Respiratory: Negative for cough, shortness of breath and wheezing.   ?Cardiovascular: Negative for chest pain and palpitations.  ?Gastrointestinal: Positive for abdominal pain. Negative for heartburn.  ?Genitourinary: Negative for dysuria.  ?Musculoskeletal: Negative for myalgias.  ?Skin: Negative for rash.  ?Neurological: Negative for dizziness and headaches.  ?Psychiatric/Behavioral: Negative for depression and suicidal ideas.  ?All other systems reviewed and are negative. ?  ?  ?Medical History: ?Past Medical History ?Past Medical  History: ?Diagnosis Date ? Anxiety   ? Thyroid disease   ? ?  ?  ?There is no problem list on file for this patient. ?  ?  ?Past Surgical History ?Past Surgical History: ?Procedure Laterality Date ? APPENDECTOMY     ? CHOLECYSTECTOMY     ? ESSURE TUBAL LIGATION     ? ?  ?  ?Allergies ?Allergies ?Allergen Reactions ? Hydrocodone-Acetaminophen Other (See Comments) ?    "feels weird" ?  ? Latex Other (See Comments) ? ?  ?  ?Current Outpatient Medications on File Prior to Visit ?Medication Sig Dispense Refill ? levothyroxine (SYNTHROID) 175 MCG tablet 1 tablet in the morning on an empty stomach     ? polyethylene glycol (MIRALAX) powder Take 17 g by mouth once daily Mix in 4-8ounces of fluid prior to taking.     ? amLODIPine (NORVASC) 5 MG tablet Take 5 mg by mouth once daily     ? valACYclovir (VALTREX) 1000 MG tablet 1 tablet     ? valsartan (DIOVAN) 320 MG tablet 1 tablet     ?  ?No current facility-administered medications on file prior to visit. ?  ?  ?Family History ?Family History ?Problem Relation Age of Onset ? Obesity Mother   ? High blood pressure (Hypertension) Mother   ? Obesity Sister   ? ?  ?  ?Social History ?  ?Tobacco Use ?Smoking Status Never ?Smokeless Tobacco Never ?  ?  ?Social History ?Social History ?  ? ?Socioeconomic History ? Marital status: Single ?Tobacco Use ? Smoking status: Never ? Smokeless tobacco: Never ?Vaping Use ? Vaping Use: Never   used ?Substance and Sexual Activity ? Alcohol use: Yes ? Drug use: Never ? ?  ?  ?Objective: ?  ?Vitals: ?  07/24/21 1048 ?BP: 122/76 ?Pulse: 101 ?Temp: 36.8 ?C (98.2 ?F) ?SpO2: 96% ?Weight: (!) 122.5 kg (270 lb) ?Height: 165.1 cm (5' 5") ?  ?Body mass index is 44.93 kg/m?. ?  ?Physical Exam ?Constitutional:   ?   Appearance: Normal appearance.  ?HENT:  ?   Head: Normocephalic and atraumatic.  ?   Mouth/Throat:  ?   Mouth: Mucous membranes are moist.  ?   Pharynx: Oropharynx is clear.  ?Eyes:  ?   General: No scleral icterus. ?   Pupils: Pupils are equal,  round, and reactive to light.  ?Cardiovascular:  ?   Rate and Rhythm: Normal rate and regular rhythm.  ?   Pulses: Normal pulses.  ?   Heart sounds: No murmur heard. ?  No friction rub. No gallop.  ?Pulmonary:  ?   Effort: Pulmonary effort is normal. No respiratory distress.  ?   Breath sounds: Normal breath sounds. No stridor.  ?Abdominal:  ?   General: Abdomen is flat.  ?Musculoskeletal:     ?   General: No swelling.  ?Skin: ?   General: Skin is warm.  ?Neurological:  ?   General: No focal deficit present.  ?   Mental Status: She is alert and oriented to person, place, and time. Mental status is at baseline.  ?Psychiatric:     ?   Mood and Affect: Mood normal.     ?   Thought Content: Thought content normal.     ?   Judgment: Judgment normal.  ?  ?  ?  ?  ?  ?Assessment and Plan: ?   ?Diagnoses and all orders for this visit: ?  ?Diverticulitis ?  ?  ?  ?1.  We will communicate with Dr. Outlaw's office to proceed with colonoscopy prior to scheduling surgery. ?2.  Patient be an excellent candidate for laparoscopic sigmoid colon resection for diverticulitis. ?2.  Discussed with the risk and benefits of the procedure to include management to: Infection, bleeding, damage management, possible leak, possible need for ostomy.  Patient voiced understanding wishes to proceed. ?  ?No follow-ups on file. ?Kaley Jutras, MD  ? ? ?  07/24/21 1048 BP:      122/76 Pulse:  101 Temp:  36.8 C (98.2 F) SpO2:  96% Weight:            (!) 122.5 kg (270 lb) Height: 165.1 cm (5\' 5" )   Body mass index is 44.93 kg/m.   Physical Exam Constitutional:      Appearance: Normal appearance.  HENT:     Head: Normocephalic and atraumatic.     Mouth/Throat:     Mouth: Mucous membranes are moist.     Pharynx: Oropharynx is clear.  Eyes:     General: No scleral icterus.    Pupils: Pupils are equal, round, and reactive to light.  Cardiovascular:     Rate and Rhythm: Normal rate and regular rhythm.     Pulses: Normal pulses.     Heart sounds: No murmur heard.   No friction rub. No gallop.  Pulmonary:     Effort: Pulmonary effort is normal. No respiratory distress.     Breath sounds: Normal breath sounds. No stridor.  Abdominal:     General: Abdomen is flat.  Musculoskeletal:        General: No swelling.  Skin:    General: Skin is warm.  Neurological:     General: No focal deficit present.     Mental Status: She is alert and oriented to person, place, and time. Mental status is at baseline.  Psychiatric:        Mood and Affect: Mood normal.        Thought Content: Thought content normal.        Judgment: Judgment normal.            Assessment and Plan:    Diagnoses and all orders for this visit:   Diverticulitis       1.  We will communicate with Dr. Erlinda Hong office to proceed with colonoscopy prior to scheduling surgery. 2.  Patient be an excellent candidate for laparoscopic sigmoid colon resection for diverticulitis. 2.  Discussed with the risk and benefits of the procedure to include management to: Infection, bleeding, damage management, possible leak, possible need for ostomy.  Patient voiced understanding wishes to proceed.   No follow-ups on file. Ralene Ok, MD

## 2022-01-09 ENCOUNTER — Encounter (HOSPITAL_COMMUNITY): Payer: Self-pay | Admitting: General Surgery

## 2022-01-09 LAB — BASIC METABOLIC PANEL
Anion gap: 9 (ref 5–15)
BUN: 5 mg/dL — ABNORMAL LOW (ref 6–20)
CO2: 21 mmol/L — ABNORMAL LOW (ref 22–32)
Calcium: 8.8 mg/dL — ABNORMAL LOW (ref 8.9–10.3)
Chloride: 104 mmol/L (ref 98–111)
Creatinine, Ser: 1.04 mg/dL — ABNORMAL HIGH (ref 0.44–1.00)
GFR, Estimated: >60 mL/min (ref >60)
Glucose, Bld: 147 mg/dL — ABNORMAL HIGH (ref 70–99)
Potassium: 4.2 mmol/L (ref 3.5–5.1)
Sodium: 134 mmol/L — ABNORMAL LOW (ref 135–145)

## 2022-01-09 LAB — CBC
HCT: 39.7 % (ref 36.0–46.0)
Hemoglobin: 13.4 g/dL (ref 12.0–15.0)
MCH: 31 pg (ref 26.0–34.0)
MCHC: 33.8 g/dL (ref 30.0–36.0)
MCV: 91.9 fL (ref 80.0–100.0)
Platelets: 315 10*3/uL (ref 150–400)
RBC: 4.32 MIL/uL (ref 3.87–5.11)
RDW: 12.5 % (ref 11.5–15.5)
WBC: 12.4 10*3/uL — ABNORMAL HIGH (ref 4.0–10.5)
nRBC: 0 % (ref 0.0–0.2)

## 2022-01-09 NOTE — Progress Notes (Signed)
Mobility Specialist - Progress Note   01/09/22 1055  Mobility  Activity Ambulated with assistance in hallway  Level of Assistance Contact guard assist, steadying assist  Assistive Device None  Distance Ambulated (ft) 200 ft  Activity Response Tolerated well  $Mobility charge 1 Mobility    Pt received in bed agreeable to mobility. Left in bed w/ call bell in reach and all needs met.   Paulla Dolly Mobility Specialist

## 2022-01-09 NOTE — Progress Notes (Signed)
1 Day Post-Op   Subjective/Chief Complaint: Pt doing well this AM Min pain Passing flatus   Objective: Vital signs in last 24 hours: Temp:  [97.5 F (36.4 C)-98.3 F (36.8 C)] 98 F (36.7 C) (09/22 0452) Pulse Rate:  [60-100] 60 (09/22 0452) Resp:  [8-19] 16 (09/22 0452) BP: (98-138)/(47-92) 107/62 (09/22 0452) SpO2:  [90 %-98 %] 97 % (09/22 0452) Weight:  [120.2 kg-128.2 kg] 128.2 kg (09/22 0500) Last BM Date : 01/08/22  Intake/Output from previous day: 09/21 0701 - 09/22 0700 In: 5483.9 [P.O.:1595; I.V.:3638.9; IV Piggyback:250] Out: 1610 [Urine:3115; Blood:30] Intake/Output this shift: No intake/output data recorded.  General appearance: alert and cooperative GI: soft, non-tender; bowel sounds normal; no masses,  no organomegaly and inc c/d/i  Lab Results:  Recent Labs    01/09/22 0030  WBC 12.4*  HGB 13.4  HCT 39.7  PLT 315   BMET Recent Labs    01/09/22 0030  NA 134*  K 4.2  CL 104  CO2 21*  GLUCOSE 147*  BUN 5*  CREATININE 1.04*  CALCIUM 8.8*   PT/INR No results for input(s): "LABPROT", "INR" in the last 72 hours. ABG No results for input(s): "PHART", "HCO3" in the last 72 hours.  Invalid input(s): "PCO2", "PO2"  Studies/Results: No results found.  Anti-infectives: Anti-infectives (From admission, onward)    Start     Dose/Rate Route Frequency Ordered Stop   01/08/22 2200  cefoTEtan (CEFOTAN) 2 g in sodium chloride 0.9 % 100 mL IVPB        2 g 200 mL/hr over 30 Minutes Intravenous Every 12 hours 01/08/22 1453 01/08/22 2319   01/08/22 1130  ciprofloxacin (CIPRO) IVPB 400 mg        400 mg 200 mL/hr over 60 Minutes Intravenous  Once 01/08/22 1117 01/08/22 1101   01/08/22 1130  metroNIDAZOLE (FLAGYL) IVPB 500 mg        500 mg 100 mL/hr over 60 Minutes Intravenous  Once 01/08/22 1117 01/08/22 2357       Assessment/Plan: s/p Procedure(s): LAPAROSCOPIC SIGMOID COLECTOMY (N/A) POD#1 -d/c foley -repeat BMP in AM to trend Cr -cont' FLD  until BMs -mobilize -home in next 1-2d   LOS: 1 day    Patty Bruce 01/09/2022

## 2022-01-09 NOTE — TOC CM/SW Note (Signed)
  Transition of Care [facility]) Screening Note   Patient Details  Name: Patty Bruce Date of Birth: Sep 06, 1975     Transition of Care Department [facility]) has reviewed patient and no TOC needs have been identified at this time. We will continue to monitor patient advancement through interdisciplinary progression rounds. If new patient transition needs arise, please place a TOC consult.

## 2022-01-09 NOTE — Plan of Care (Signed)
  Problem: Clinical Measurements: Goal: Diagnostic test results will improve Outcome: Not Progressing   Problem: Elimination: Goal: Will not experience complications related to bowel motility Outcome: Not Progressing   Problem: Pain Managment: Goal: General experience of comfort will improve Outcome: Not Progressing   

## 2022-01-10 LAB — BASIC METABOLIC PANEL
Anion gap: 7 (ref 5–15)
BUN: 8 mg/dL (ref 6–20)
CO2: 24 mmol/L (ref 22–32)
Calcium: 8.7 mg/dL — ABNORMAL LOW (ref 8.9–10.3)
Chloride: 103 mmol/L (ref 98–111)
Creatinine, Ser: 1.01 mg/dL — ABNORMAL HIGH (ref 0.44–1.00)
GFR, Estimated: >60 mL/min (ref >60)
Glucose, Bld: 101 mg/dL — ABNORMAL HIGH (ref 70–99)
Potassium: 3.4 mmol/L — ABNORMAL LOW (ref 3.5–5.1)
Sodium: 134 mmol/L — ABNORMAL LOW (ref 135–145)

## 2022-01-10 LAB — CBC
HCT: 40.1 % (ref 36.0–46.0)
Hemoglobin: 13.3 g/dL (ref 12.0–15.0)
MCH: 31.1 pg (ref 26.0–34.0)
MCHC: 33.2 g/dL (ref 30.0–36.0)
MCV: 93.9 fL (ref 80.0–100.0)
Platelets: 294 10*3/uL (ref 150–400)
RBC: 4.27 MIL/uL (ref 3.87–5.11)
RDW: 13 % (ref 11.5–15.5)
WBC: 12.5 10*3/uL — ABNORMAL HIGH (ref 4.0–10.5)
nRBC: 0 % (ref 0.0–0.2)

## 2022-01-10 MED ORDER — POTASSIUM CHLORIDE 10 MEQ/100ML IV SOLN
10.0000 meq | INTRAVENOUS | Status: AC
  Administered 2022-01-10 (×2): 10 meq via INTRAVENOUS
  Filled 2022-01-10 (×2): qty 100

## 2022-01-10 MED ORDER — SODIUM CHLORIDE 0.9 % IV SOLN
12.5000 mg | Freq: Once | INTRAVENOUS | Status: AC
  Administered 2022-01-10: 12.5 mg via INTRAVENOUS
  Filled 2022-01-10: qty 0.5

## 2022-01-10 MED ORDER — LACTATED RINGERS IV SOLN: INTRAVENOUS | Status: DC

## 2022-01-10 NOTE — Plan of Care (Signed)
  Problem: Nutrition: Goal: Adequate nutrition will be maintained Outcome: Not Progressing   Problem: Elimination: Goal: Will not experience complications related to bowel motility Outcome: Not Progressing   Problem: Pain Managment: Goal: General experience of comfort will improve Outcome: Not Progressing   

## 2022-01-10 NOTE — Progress Notes (Signed)
Mobility Specialist - Progress Note   01/10/22 1012  Mobility  Activity Ambulated with assistance in room  Level of Assistance Minimal assist, patient does 75% or more  Assistive Device None  Distance Ambulated (ft) 10 ft  Activity Response Tolerated well  $Mobility charge 1 Mobility    Pt in bed requesting to use BR. MinA required to help patient move toward EOB. Left EOB w/ call bell in reach and all needs met.   Paulla Dolly Mobility Specialist

## 2022-01-10 NOTE — Progress Notes (Signed)
At 0130 patient called asking assistance, this nurse came to see patient at the bathroom vomiting, noted blood dripping on legs, per patient it came out from her rectum after sghe gagged, assisted patient, on call MD paged.  At [place], Dr. Dema Severin called and informed of situation, lovenox discontinues, LR at 71 mls/hr ordered, patient now on NPO and cbc ordered. Patient updated on plan. Assisted back to bed and dilaudid administered.

## 2022-01-10 NOTE — Progress Notes (Signed)
2 Days Post-Op  Subjective: CC: Notes reviewed N/v overnight. Made npo. Reports nausea improved this am. Last vomiting was in the middle of the night, none this morning. Some hiccups, burping/belching. Thinks this n/v came after po intake. Abdominal pain mainly around incisions and reports as sore. RLQ incision particularly sore when she gets up and out of bed. She reports some bruising around this area.  Liquid BRB per rectum overnight (x2). Lovenox d/c'd. Given IVF. Hgb stable at 13.3 this am.  No tachycardia or hypotension this am. Passing flatus. Reports more formed bm this am with less blood. Has been out of bed and denies lightheadedness or dizziness. Voiding without difficulty. No vaginal d/c or bleeding. Walked 278ft with mobility tech yesterday  Objective: Vital signs in last 24 hours: Temp:  [97.6 F (36.4 C)-99.3 F (37.4 C)] 99.2 F (37.3 C) (09/23 0743) Pulse Rate:  [57-87] 87 (09/23 0743) Resp:  [16-20] 16 (09/23 0743) BP: (102-144)/(60-75) 127/67 (09/23 0743) SpO2:  [90 %-97 %] 90 % (09/23 0743) Last BM Date : 01/09/22  Intake/Output from previous day: 09/22 0701 - 09/23 0700 In: 1460.8 [P.O.:1200; I.V.:260.8] Out: 100 [Urine:100] Intake/Output this shift: No intake/output data recorded.  PE: Gen:  Alert, NAD, pleasant Card:  Reg Pulm:  CTAB, no W/R/R, effort normal Abd: Soft, ND, mild and appropriately tender around her incisions. +BS. Dressings in place over laparoscopic incisions. RLQ laparoscopic incision removed, there is some faint bruising around this area without obvious erythema, heat, or induration. Incision is with staples in place without drainage. The area is very soft and she allows me to press with deep palpation around the area without pain. Pfannenstiel incision with honeycomb dressing in place, cdi Ext:  No LE edema or calf tenderness Psych: A&Ox3  Skin: no rashes noted, warm and dry  Lab Results:  Recent Labs    01/09/22 0030  01/10/22 0117  WBC 12.4* 12.5*  HGB 13.4 13.3  HCT 39.7 40.1  PLT 315 294   BMET Recent Labs    01/09/22 0030 01/10/22 0117  NA 134* 134*  K 4.2 3.4*  CL 104 103  CO2 21* 24  GLUCOSE 147* 101*  BUN 5* 8  CREATININE 1.04* 1.01*  CALCIUM 8.8* 8.7*   PT/INR No results for input(s): "LABPROT", "INR" in the last 72 hours. CMP     Component Value Date/Time   NA 134 (L) 01/10/2022 0117   K 3.4 (L) 01/10/2022 0117   CL 103 01/10/2022 0117   CO2 24 01/10/2022 0117   GLUCOSE 101 (H) 01/10/2022 0117   BUN 8 01/10/2022 0117   CREATININE 1.01 (H) 01/10/2022 0117   CALCIUM 8.7 (L) 01/10/2022 0117   PROT 8.1 11/27/2016 1603   ALBUMIN 4.2 11/27/2016 1603   AST 26 11/27/2016 1603   ALT 32 11/27/2016 1603   ALKPHOS 97 11/27/2016 1603   BILITOT 0.9 11/27/2016 1603   GFRNONAA >60 01/10/2022 0117   GFRAA >60 11/27/2016 1603   Lipase     Component Value Date/Time   LIPASE 36 11/27/2016 1603    Studies/Results: No results found.  Anti-infectives: Anti-infectives (From admission, onward)    Start     Dose/Rate Route Frequency Ordered Stop   01/08/22 2200  cefoTEtan (CEFOTAN) 2 g in sodium chloride 0.9 % 100 mL IVPB        2 g 200 mL/hr over 30 Minutes Intravenous Every 12 hours 01/08/22 1453 01/08/22 2319   01/08/22 1130  ciprofloxacin (CIPRO) IVPB 400 mg  400 mg 200 mL/hr over 60 Minutes Intravenous  Once 01/08/22 1117 01/08/22 1101   01/08/22 1130  metroNIDAZOLE (FLAGYL) IVPB 500 mg        500 mg 100 mL/hr over 60 Minutes Intravenous  Once 01/08/22 1117 01/08/22 2357        Assessment/Plan POD 2 s/p Lap sigmoid colectomy for diverticulitis by Dr. Derrell Lolling on 01/08/22 - N/v overnight. Keep npo for now. Having bowel function. If further n/v, get xray  - BRB per rectum overnight. VSS w/o tachycardia or hypotension. Hold Lovenox. Hgb stable. Less blood in stool this am. Repeat cbc in am. Monitor.  - Monitor RLQ laparoscopic incision. Ice - Path pending -  Mobilize - Pulm toilet  FEN - NPO (sips with meds), IVF at 3ml/hr (cr 1.01, baseline 0.93 repeat in am), replace K VTE - SCDs, chem ppx on hold ID - Cipro/flagly peri-op, none currently Foley - None, voidng  HTN - home meds Hypothyroidism - home meds Fibromyalgia    LOS: 2 days    Jacinto Halim , [facility] 01/10/2022, 7:44 AM Please see Amion for pager number during day hours 7:00am-4:30pm

## 2022-01-10 NOTE — Progress Notes (Signed)
Mobility Specialist - Progress Note   01/10/22 1008  Mobility  Activity Ambulated with assistance in hallway  Level of Assistance Standby assist, set-up cues, supervision of patient - no hands on  Assistive Device None  Distance Ambulated (ft) 200 ft  Activity Response Tolerated well  $Mobility charge 1 Mobility    Pt received in bed agreeable to mobility. Left in bed w/ call bell in reach and all needs met.   Paulla Dolly Mobility Specialist

## 2022-01-11 LAB — BASIC METABOLIC PANEL
Anion gap: 8 (ref 5–15)
BUN: 6 mg/dL (ref 6–20)
CO2: 25 mmol/L (ref 22–32)
Calcium: 8.5 mg/dL — ABNORMAL LOW (ref 8.9–10.3)
Chloride: 104 mmol/L (ref 98–111)
Creatinine, Ser: 1 mg/dL (ref 0.44–1.00)
GFR, Estimated: >60 mL/min (ref >60)
Glucose, Bld: 85 mg/dL (ref 70–99)
Potassium: 3.5 mmol/L (ref 3.5–5.1)
Sodium: 137 mmol/L (ref 135–145)

## 2022-01-11 LAB — CBC
HCT: 35.1 % — ABNORMAL LOW (ref 36.0–46.0)
Hemoglobin: 11.7 g/dL — ABNORMAL LOW (ref 12.0–15.0)
MCH: 31.4 pg (ref 26.0–34.0)
MCHC: 33.3 g/dL (ref 30.0–36.0)
MCV: 94.1 fL (ref 80.0–100.0)
Platelets: 265 10*3/uL (ref 150–400)
RBC: 3.73 MIL/uL — ABNORMAL LOW (ref 3.87–5.11)
RDW: 13.2 % (ref 11.5–15.5)
WBC: 8.7 10*3/uL (ref 4.0–10.5)
nRBC: 0 % (ref 0.0–0.2)

## 2022-01-11 NOTE — Progress Notes (Signed)
Mobility Specialist - Progress Note   01/11/22 1638  Mobility  Activity Ambulated independently in hallway  Level of Assistance Independent  Assistive Device None  Distance Ambulated (ft) 200 ft  Activity Response Tolerated well  $Mobility charge 1 Mobility    Pt received in bed agreeable. Left in bed w/ call bell in reach and all needs met.   Paulla Dolly Mobility Specialist

## 2022-01-11 NOTE — Progress Notes (Addendum)
3 Days Post-Op  Subjective: CC: Feeling better. No further nausea or vomiting. Had a loose bm yesterday without blood or melena. Pain improved, some soreness around her incisions - well controlled with medications. Voiding. Mobilizing in halls.   Objective: Vital signs in last 24 hours: Temp:  [98.3 F (36.8 C)-99.7 F (37.6 C)] 99.7 F (37.6 C) (09/24 0751) Pulse Rate:  [71-88] 81 (09/24 0751) Resp:  [16-18] 18 (09/24 0751) BP: (118-141)/(66-80) 125/74 (09/24 0751) SpO2:  [93 %-98 %] 94 % (09/24 0751) Weight:  [128.8 kg] 128.8 kg (09/24 0500) Last BM Date : 01/10/22  Intake/Output from previous day: 09/23 0701 - 09/24 0700 In: 848.4 [I.V.:743.2; IV Piggyback:105.3] Out: -  Intake/Output this shift: No intake/output data recorded.  PE: Gen:  Alert, NAD, pleasant Card:  Reg Pulm:  CTAB, no W/R/R, effort normal Abd: Soft, ND, mild and appropriately tender around her incisions. +BS. RLQ laparoscopic incision with some faint bruising around without obvious erythema, heat, or induration; incision is with staples in place without drainage; the area is very soft and she allows me to press in this area without pain. Dressings cdi over other remaining laparoscopic incisions.  Pfannenstiel incision with honeycomb dressing in place, cdi Ext:  No LE edema or calf tenderness Psych: A&Ox3  Skin: no rashes noted, warm and dry  Lab Results:  Recent Labs    01/10/22 0117 01/11/22 0056  WBC 12.5* 8.7  HGB 13.3 11.7*  HCT 40.1 35.1*  PLT 294 265   BMET Recent Labs    01/10/22 0117 01/11/22 0056  NA 134* 137  K 3.4* 3.5  CL 103 104  CO2 24 25  GLUCOSE 101* 85  BUN 8 6  CREATININE 1.01* 1.00  CALCIUM 8.7* 8.5*   PT/INR No results for input(s): "LABPROT", "INR" in the last 72 hours. CMP     Component Value Date/Time   NA 137 01/11/2022 0056   K 3.5 01/11/2022 0056   CL 104 01/11/2022 0056   CO2 25 01/11/2022 0056   GLUCOSE 85 01/11/2022 0056   BUN 6 01/11/2022 0056    CREATININE 1.00 01/11/2022 0056   CALCIUM 8.5 (L) 01/11/2022 0056   PROT 8.1 11/27/2016 1603   ALBUMIN 4.2 11/27/2016 1603   AST 26 11/27/2016 1603   ALT 32 11/27/2016 1603   ALKPHOS 97 11/27/2016 1603   BILITOT 0.9 11/27/2016 1603   GFRNONAA >60 01/11/2022 0056   GFRAA >60 11/27/2016 1603   Lipase     Component Value Date/Time   LIPASE 36 11/27/2016 1603    Studies/Results: No results found.  Anti-infectives: Anti-infectives (From admission, onward)    Start     Dose/Rate Route Frequency Ordered Stop   01/08/22 2200  cefoTEtan (CEFOTAN) 2 g in sodium chloride 0.9 % 100 mL IVPB        2 g 200 mL/hr over 30 Minutes Intravenous Every 12 hours 01/08/22 1453 01/08/22 2319   01/08/22 1130  ciprofloxacin (CIPRO) IVPB 400 mg        400 mg 200 mL/hr over 60 Minutes Intravenous  Once 01/08/22 1117 01/08/22 1101   01/08/22 1130  metroNIDAZOLE (FLAGYL) IVPB 500 mg        500 mg 100 mL/hr over 60 Minutes Intravenous  Once 01/08/22 1117 01/08/22 2357        Assessment/Plan POD 3 s/p Lap sigmoid colectomy for diverticulitis by Dr. Rosendo Gros on 01/08/22 - N/v resolved. Having bowel function. Restart CLD. - Bloody bm's resolved. Cont to  hold lovenox today. Repeat labs in am (hgb 11.7 from 13.3) - Monitor RLQ laparoscopic incision. Ice - Path pending - Mobilize - Pulm toilet  FEN - CLD, IVF at 72ml/hr  VTE - SCDs, chem ppx on hold ID - Cipro/flagly peri-op, none currently Foley - None, voidng  HTN - home meds Hypothyroidism - home meds Fibromyalgia    LOS: 3 days    Jacinto Halim , [facility] 01/11/2022, 7:56 AM Please see Amion for pager number during day hours 7:00am-4:30pm

## 2022-01-11 NOTE — Progress Notes (Signed)
Mobility Specialist - Progress Note   01/11/22 0947  Mobility  Activity Ambulated with assistance in hallway  Level of Assistance Standby assist, set-up cues, supervision of patient - no hands on  Assistive Device None  Distance Ambulated (ft) 200 ft  Activity Response Tolerated well  $Mobility charge 1 Mobility    Pt received in bed agreeable to mobility. No complaints throughout, no physical assistance required. Left in bed w/ call bell in reach and all needs met.   Paulla Dolly Mobility Specialist

## 2022-01-12 LAB — CBC
HCT: 36.1 % (ref 36.0–46.0)
Hemoglobin: 11.7 g/dL — ABNORMAL LOW (ref 12.0–15.0)
MCH: 30.7 pg (ref 26.0–34.0)
MCHC: 32.4 g/dL (ref 30.0–36.0)
MCV: 94.8 fL (ref 80.0–100.0)
Platelets: 233 10*3/uL (ref 150–400)
RBC: 3.81 MIL/uL — ABNORMAL LOW (ref 3.87–5.11)
RDW: 12.8 % (ref 11.5–15.5)
WBC: 7.2 10*3/uL (ref 4.0–10.5)
nRBC: 0 % (ref 0.0–0.2)

## 2022-01-12 LAB — BASIC METABOLIC PANEL
Anion gap: 8 (ref 5–15)
BUN: 6 mg/dL (ref 6–20)
CO2: 26 mmol/L (ref 22–32)
Calcium: 8.7 mg/dL — ABNORMAL LOW (ref 8.9–10.3)
Chloride: 104 mmol/L (ref 98–111)
Creatinine, Ser: 0.79 mg/dL (ref 0.44–1.00)
GFR, Estimated: >60 mL/min (ref >60)
Glucose, Bld: 100 mg/dL — ABNORMAL HIGH (ref 70–99)
Potassium: 3.4 mmol/L — ABNORMAL LOW (ref 3.5–5.1)
Sodium: 138 mmol/L (ref 135–145)

## 2022-01-12 LAB — SURGICAL PATHOLOGY

## 2022-01-12 MED ORDER — METHOCARBAMOL 500 MG PO TABS: 500.0000 mg | ORAL_TABLET | Freq: Four times a day (QID) | ORAL | Status: DC | PRN

## 2022-01-12 MED ORDER — OXYCODONE-ACETAMINOPHEN 5-325 MG PO TABS: 1.0000 | ORAL_TABLET | ORAL | 0 refills | Status: AC | PRN

## 2022-01-12 MED ORDER — HYDROMORPHONE HCL 1 MG/ML IJ SOLN: 0.5000 mg | INTRAMUSCULAR | Status: DC | PRN

## 2022-01-12 NOTE — Discharge Instructions (Signed)
ABDOMINAL SURGERY: POST OP INSTRUCTIONS  DIET: Follow a light bland diet the first 24 hours after arrival home, such as soup, liquids, crackers, etc.  Be sure to include lots of fluids daily.  Avoid fast food or heavy meals as your are more likely to get nauseated.  Eat a low fat the next few days after surgery.    Take your usually prescribed home medications unless otherwise directed.  PAIN CONTROL: Pain is best controlled by a usual combination of three different methods TOGETHER: Ice/Heat Over the counter pain medication Prescription pain medication Most patients will experience some swelling and bruising around the incisions.  Ice packs or heating pads (30-60 minutes up to 6 times a day) will help. Use ice for the first few days to help decrease swelling and bruising, then switch to heat to help relax tight/sore spots and speed recovery.  Some people prefer to use ice alone, heat alone, alternating between ice & heat.  Experiment to what works for you.  Swelling and bruising can take several weeks to resolve.   It is helpful to take an over-the-counter pain medication regularly for the first few weeks.  Choose one of the following that works best for you: Naproxen (Aleve, etc)  Two 220mg  tabs twice a day Ibuprofen (Advil, etc) Three 200mg  tabs four times a day (every meal & bedtime) Acetaminophen (Tylenol, etc) 500-650mg  four times a day (every meal & bedtime) A  prescription for pain medication (such as oxycodone, hydrocodone, etc) should be given to you upon discharge.  Take your pain medication as prescribed.  If you are having problems/concerns with the prescription medicine (does not control pain, nausea, vomiting, rash, itching, etc), please call us [phone] to see if we need to switch you to a different pain medicine that will work better for you and/or control your side effect better. If you need a refill on your pain medication, please contact your pharmacy.  They will contact  our office to request authorization. Prescriptions will not be filled after 5 pm or on week-ends.  Avoid getting constipated.  Between the surgery and the pain medications, it is common to experience some constipation.  Increasing fluid intake and taking a fiber supplement (such as Metamucil, Citrucel, FiberCon, MiraLax, etc) 1-2 times a day regularly will usually help prevent this problem from occurring.  A mild laxative (prune juice, Milk of Magnesia, MiraLax, etc) should be taken according to package directions if there are no bowel movements after 48 hours.   Watch out for diarrhea.  If you have many loose bowel movements, simplify your diet to bland foods & liquids for a few days.  Stop any stool softeners and decrease your fiber supplement.  Switching to mild anti-diarrheal medications (Loperamide/Imodium, Kayopectate, Pepto Bismol) can help.  If this worsens or does not improve, please call us.  Wash / shower every day.  You may shower over the incision / wound.  Avoid baths until the skin is fully healed.  Continue to shower over incision(s) after the dressing is off. Remove your waterproof bandages 5 days after surgery.  You may leave the incision open to air.  Remove any wicks or ribbons in your wound.  If you have an open wound, please see wound care instructions. You may replace a dressing/Band-Aid to cover the incision for comfort if you wish.  ACTIVITIES as tolerated:   You may resume regular (light) daily activities beginning the next day--such as daily self-care, walking, climbing stairs--gradually increasing activities as  tolerated.  If you can walk 30 minutes without difficulty, it is safe to try more intense activity such as jogging, treadmill, bicycling, low-impact aerobics, swimming, etc. Save the most intensive and strenuous activity for last such as sit-ups, heavy lifting, contact sports, etc  Refrain from any heavy lifting or straining until you are off narcotics for pain control.    DO NOT PUSH THROUGH PAIN.  Let pain be your guide: If it hurts to do something, don't do it.  Pain is your body warning you to avoid that activity for another week until the pain goes down. You may drive when you are no longer taking prescription pain medication, you can comfortably wear a seatbelt, and you can safely maneuver your car and apply brakes. You may have sexual intercourse when it is comfortable.   FOLLOW UP in our office Please call [facility] at [phone] to set up an appointment to see your surgeon in the office for a follow-up appointment approximately 1-2 weeks after your surgery. Make sure that you call for this appointment the day you arrive home to insure a convenient appointment time. 10. IF YOU HAVE DISABILITY OR FAMILY LEAVE FORMS, BRING THEM TO THE OFFICE FOR PROCESSING.  DO NOT GIVE THEM TO YOUR DOCTOR.   WHEN TO CALL us [phone]: Poor pain control Reactions / problems with new medications (rash/itching, nausea, etc)  Fever over 101.5 F (38.5 C) Inability to urinate Nausea and/or vomiting Worsening swelling or bruising Continued bleeding from incision. Increased pain, redness, or drainage from the incision  The clinic staff is available to answer your questions during regular business hours (8:30am-5pm).  Please don't hesitate to call and ask to speak to one of our nurses for clinical concerns.   A surgeon from [facility] is always on call at the hospitals   If you have a medical emergency, go to the nearest emergency room or call 911.    [facility], [address], [place], [place], [place]  [id number] ? MAIN: [phone] ? TOLL FREE: [phone] ? FAX [phone] [email or web address]

## 2022-01-12 NOTE — Progress Notes (Signed)
Mobility Specialist - Progress Note   01/12/22 1129  Mobility  Activity Ambulated independently in hallway  Level of Assistance Independent  Assistive Device None  Distance Ambulated (ft) 300 ft  Activity Response Tolerated well  $Mobility charge 1 Mobility    Pt received in bed agreeable to mobility. Left in bed w/ call bell in reach and all needs met.  Paulla Dolly Mobility Specialist

## 2022-01-12 NOTE — Plan of Care (Signed)

## 2022-01-12 NOTE — Progress Notes (Signed)
Patient given discharge instructions and stated understanding. 

## 2022-01-12 NOTE — Discharge Summary (Signed)
Physician Discharge Summary  Patient ID: Patty Bruce MRN: [id number] DOB/AGE: 24-Jul-1975 46 y.o.  Admit date: 01/08/2022 Discharge date: 01/12/2022  Admission Diagnoses: Diverticulitis  Discharge Diagnoses:  Principal Problem:   S/P partial resection of colon   Discharged Condition: good  Hospital Course: Patient was admitted postoperatively.  Patient underwent lap assisted sigmoid colectomy.  Please see op note for details.  Patient was admitted for. The rash protocol.  Patient was advanced from a liquid diet.  She did experience some nausea but was back down to liquid diet.  She had good pain control.  She was otherwise ambulating on her own.  She did have some bloody bowel movements initially however this cleared up.  Patient otherwise was afebrile, deemed stable for discharge, discharged home.  Consults: None  Significant Diagnostic Studies: None  Treatments: surgery: As above  Discharge Exam: Blood pressure 120/79, pulse 76, temperature 97.9 F (36.6 C), temperature source Oral, resp. rate 17, height 5\' 5"  (1.651 m), weight 130.6 kg, last menstrual period 12/26/2021, SpO2 95 %. General appearance: alert and cooperative GI: soft, non-tender; bowel sounds normal; no masses,  no organomegaly and incision clean dry and intact.  Disposition: Discharge disposition: 01-Home or Self Care       Discharge Instructions     Diet - low sodium heart healthy   Complete by: As directed    Increase activity slowly   Complete by: As directed       Allergies as of 01/12/2022       Reactions   Vicodin [hydrocodone-acetaminophen] Other (See Comments)   "feels weird"   Latex Rash        Medication List     TAKE these medications    acetaminophen 325 MG tablet Commonly known as: TYLENOL Take 650 mg by mouth every 6 (six) hours as needed for moderate pain.   amLODipine 5 MG tablet Commonly known as: NORVASC Take 5 mg by mouth daily.   levothyroxine 175 MCG  tablet Commonly known as: SYNTHROID Take 175 mcg by mouth every morning.   metroNIDAZOLE 500 MG tablet Commonly known as: FLAGYL Take 1 tablet (500 mg total) by mouth 3 (three) times daily.   neomycin 500 MG tablet Commonly known as: MYCIFRADIN Take 500 mg by mouth once. 6 tbs the day prior surgery   ondansetron 4 MG tablet Commonly known as: ZOFRAN Take 1 tablet (4 mg total) by mouth every 6 (six) hours.   oxyCODONE-acetaminophen 5-325 MG tablet Commonly known as: Percocet Take 1 tablet by mouth every 4 (four) hours as needed for severe pain.   valsartan 320 MG tablet Commonly known as: DIOVAN Take 320 mg by mouth daily.        Follow-up Information     Ralene Ok, MD. Schedule an appointment as soon as possible for a visit in 3 week(s).   Specialty: General Surgery Why: Post op visit Contact information: [place] [id number] [phone]                 Signed: Ralene Ok 01/12/2022, 3:44 PM

## 2022-01-12 NOTE — Progress Notes (Signed)
4 Days Post-Op  Subjective: CC: Doing well. Some pain around incision, mainly the RLQ one still, but much improved. Well controlled with pain meds but still using IV pain meds on occasion. Tolerating fld without n/v. Passing flatus. Non-bloody bm yesterday. Mobilized 271ft independently with mobility tech yesterday. Voiding.   Objective: Vital signs in last 24 hours: Temp:  [97.9 F (36.6 C)-99 F (37.2 C)] 97.9 F (36.6 C) (09/25 0743) Pulse Rate:  [72-89] 76 (09/25 0743) Resp:  [17-18] 17 (09/25 0743) BP: (116-131)/(72-80) 120/79 (09/25 0743) SpO2:  [92 %-95 %] 95 % (09/25 0743) Weight:  [128.5 kg] 128.5 kg (09/25 0427) Last BM Date : 01/10/22  Intake/Output from previous day: 09/24 0701 - 09/25 0700 In: 1861.1 [P.O.:240; I.V.:1621.1] Out: -  Intake/Output this shift: No intake/output data recorded.  PE: Gen:  Alert, NAD, pleasant Card:  Reg Pulm:  CTAB, no W/R/R, effort normal Abd: Soft, ND, mild and appropriately tender around her incisions. +BS. RLQ laparoscopic incision with some bruising around without obvious erythema, heat, or induration; incision is with staples in place without drainage; the area is very soft and she allows me to press in this area without pain. Dressings cdi over other remaining laparoscopic incisions.  Pfannenstiel incision with honeycomb dressing in place, cdi Ext:  No LE edema Psych: A&Ox3    Lab Results:  Recent Labs    01/11/22 0056 01/12/22 0018  WBC 8.7 7.2  HGB 11.7* 11.7*  HCT 35.1* 36.1  PLT 265 233   BMET Recent Labs    01/11/22 0056 01/12/22 0018  NA 137 138  K 3.5 3.4*  CL 104 104  CO2 25 26  GLUCOSE 85 100*  BUN 6 6  CREATININE 1.00 0.79  CALCIUM 8.5* 8.7*   PT/INR No results for input(s): "LABPROT", "INR" in the last 72 hours. CMP     Component Value Date/Time   NA 138 01/12/2022 0018   K 3.4 (L) 01/12/2022 0018   CL 104 01/12/2022 0018   CO2 26 01/12/2022 0018   GLUCOSE 100 (H) 01/12/2022 0018   BUN  6 01/12/2022 0018   CREATININE 0.79 01/12/2022 0018   CALCIUM 8.7 (L) 01/12/2022 0018   PROT 8.1 11/27/2016 1603   ALBUMIN 4.2 11/27/2016 1603   AST 26 11/27/2016 1603   ALT 32 11/27/2016 1603   ALKPHOS 97 11/27/2016 1603   BILITOT 0.9 11/27/2016 1603   GFRNONAA >60 01/12/2022 0018   GFRAA >60 11/27/2016 1603   Lipase     Component Value Date/Time   LIPASE 36 11/27/2016 1603    Studies/Results: No results found.  Anti-infectives: Anti-infectives (From admission, onward)    Start     Dose/Rate Route Frequency Ordered Stop   01/08/22 2200  cefoTEtan (CEFOTAN) 2 g in sodium chloride 0.9 % 100 mL IVPB        2 g 200 mL/hr over 30 Minutes Intravenous Every 12 hours 01/08/22 1453 01/08/22 2319   01/08/22 1130  ciprofloxacin (CIPRO) IVPB 400 mg        400 mg 200 mL/hr over 60 Minutes Intravenous  Once 01/08/22 1117 01/08/22 1101   01/08/22 1130  metroNIDAZOLE (FLAGYL) IVPB 500 mg        500 mg 100 mL/hr over 60 Minutes Intravenous  Once 01/08/22 1117 01/08/22 2357        Assessment/Plan POD 4 s/p Lap sigmoid colectomy for diverticulitis by Dr. Rosendo Gros on 01/08/22 - N/v resolved. Tolerating fld, adv diet soft diet - Bloody  bm's resolved. Hgb stable. VSS.  - Monitor RLQ laparoscopic incision. Ice - Path pending - Mobilize - Pulm toilet   FEN - Soft diet, d/c IVF VTE - SCDs, chem ppx on hold - if still here tomorrow and hgb stable could resume ID - Cipro/flagly peri-op, none currently Foley - None, voidng. Plan - Advance diet. Wean off IV pain medications. Possible d/c this pm.    HTN - home meds Hypothyroidism - home meds Fibromyalgia   LOS: 4 days    Patty Bruce , [facility] 01/12/2022, 10:16 AM Please see Amion for pager number during day hours 7:00am-4:30pm

## 2022-01-23 DIAGNOSIS — I10 Essential (primary) hypertension: Secondary | ICD-10-CM | POA: Diagnosis not present

## 2022-01-23 DIAGNOSIS — Z13220 Encounter for screening for lipoid disorders: Secondary | ICD-10-CM | POA: Diagnosis not present

## 2022-01-23 DIAGNOSIS — Z0000 Encounter for general adult medical examination without abnormal findings: Secondary | ICD-10-CM | POA: Diagnosis not present

## 2022-01-23 DIAGNOSIS — E890 Postprocedural hypothyroidism: Secondary | ICD-10-CM | POA: Diagnosis not present

## 2022-02-12 ENCOUNTER — Other Ambulatory Visit (HOSPITAL_COMMUNITY): Payer: Self-pay | Admitting: Student

## 2022-02-12 ENCOUNTER — Other Ambulatory Visit: Payer: Self-pay | Admitting: Student

## 2022-02-12 DIAGNOSIS — T889XXA Complication of surgical and medical care, unspecified, initial encounter: Secondary | ICD-10-CM

## 2022-02-12 DIAGNOSIS — Z9049 Acquired absence of other specified parts of digestive tract: Secondary | ICD-10-CM

## 2022-02-13 ENCOUNTER — Ambulatory Visit (HOSPITAL_COMMUNITY)
Admission: RE | Admit: 2022-02-13 | Discharge: 2022-02-13 | Disposition: A | Discharge status: Home / Self Care | Payer: BC Managed Care – PPO | Source: Ambulatory Visit | Attending: Student | Admitting: Student

## 2022-02-13 DIAGNOSIS — R1030 Lower abdominal pain, unspecified: Secondary | ICD-10-CM | POA: Diagnosis not present

## 2022-02-13 DIAGNOSIS — T889XXA Complication of surgical and medical care, unspecified, initial encounter: Secondary | ICD-10-CM

## 2022-02-13 DIAGNOSIS — Z9049 Acquired absence of other specified parts of digestive tract: Secondary | ICD-10-CM | POA: Diagnosis not present

## 2022-02-13 MED ORDER — IOHEXOL 300 MG/ML  SOLN
100.0000 mL | Freq: Once | INTRAMUSCULAR | Status: AC | PRN
  Administered 2022-02-13: 100 mL via INTRAVENOUS

## 2022-02-13 MED ORDER — IOHEXOL 9 MG/ML PO SOLN
1000.0000 mL | ORAL | Status: AC
  Administered 2022-02-13: 1000 mL via ORAL

## 2022-02-13 MED ORDER — IOHEXOL 9 MG/ML PO SOLN
ORAL | Status: AC
  Filled 2022-02-13: qty 1000

## 2022-02-13 MED ORDER — SODIUM CHLORIDE (PF) 0.9 % IJ SOLN
INTRAMUSCULAR | Status: AC
  Filled 2022-02-13: qty 50

## 2022-04-17 DIAGNOSIS — J029 Acute pharyngitis, unspecified: Secondary | ICD-10-CM | POA: Diagnosis not present

## 2022-04-17 DIAGNOSIS — R509 Fever, unspecified: Secondary | ICD-10-CM | POA: Diagnosis not present

## 2022-04-17 DIAGNOSIS — R051 Acute cough: Secondary | ICD-10-CM | POA: Diagnosis not present

## 2022-04-17 DIAGNOSIS — H6503 Acute serous otitis media, bilateral: Secondary | ICD-10-CM | POA: Diagnosis not present

## 2022-07-23 DIAGNOSIS — R350 Frequency of micturition: Secondary | ICD-10-CM | POA: Diagnosis not present

## 2022-07-23 DIAGNOSIS — E890 Postprocedural hypothyroidism: Secondary | ICD-10-CM | POA: Diagnosis not present

## 2022-07-23 DIAGNOSIS — I10 Essential (primary) hypertension: Secondary | ICD-10-CM | POA: Diagnosis not present

## 2022-07-29 DIAGNOSIS — Z01419 Encounter for gynecological examination (general) (routine) without abnormal findings: Secondary | ICD-10-CM | POA: Diagnosis not present

## 2022-07-29 DIAGNOSIS — Z124 Encounter for screening for malignant neoplasm of cervix: Secondary | ICD-10-CM | POA: Diagnosis not present

## 2022-08-18 ENCOUNTER — Other Ambulatory Visit: Payer: Self-pay | Admitting: Obstetrics and Gynecology

## 2022-08-18 DIAGNOSIS — Z1231 Encounter for screening mammogram for malignant neoplasm of breast: Secondary | ICD-10-CM

## 2022-08-18 DIAGNOSIS — N946 Dysmenorrhea, unspecified: Secondary | ICD-10-CM | POA: Diagnosis not present

## 2022-08-18 DIAGNOSIS — N951 Menopausal and female climacteric states: Secondary | ICD-10-CM | POA: Diagnosis not present

## 2022-09-11 DIAGNOSIS — E890 Postprocedural hypothyroidism: Secondary | ICD-10-CM | POA: Diagnosis not present

## 2022-09-21 ENCOUNTER — Ambulatory Visit
Admission: RE | Admit: 2022-09-21 | Discharge: 2022-09-21 | Disposition: A | Discharge status: Home / Self Care | Payer: BC Managed Care – PPO | Source: Ambulatory Visit | Attending: Obstetrics and Gynecology | Admitting: Obstetrics and Gynecology

## 2022-09-21 DIAGNOSIS — Z1231 Encounter for screening mammogram for malignant neoplasm of breast: Secondary | ICD-10-CM

## 2022-10-15 DIAGNOSIS — R062 Wheezing: Secondary | ICD-10-CM | POA: Diagnosis not present

## 2022-10-15 DIAGNOSIS — R0981 Nasal congestion: Secondary | ICD-10-CM | POA: Diagnosis not present

## 2022-10-15 DIAGNOSIS — R051 Acute cough: Secondary | ICD-10-CM | POA: Diagnosis not present

## 2022-10-15 DIAGNOSIS — J029 Acute pharyngitis, unspecified: Secondary | ICD-10-CM | POA: Diagnosis not present

## 2022-10-15 DIAGNOSIS — R0982 Postnasal drip: Secondary | ICD-10-CM | POA: Diagnosis not present

## 2022-10-15 DIAGNOSIS — J209 Acute bronchitis, unspecified: Secondary | ICD-10-CM | POA: Diagnosis not present

## 2022-10-16 ENCOUNTER — Ambulatory Visit
Admission: RE | Admit: 2022-10-16 | Discharge: 2022-10-16 | Disposition: A | Discharge status: Home / Self Care | Payer: BC Managed Care – PPO | Source: Ambulatory Visit | Attending: Obstetrics and Gynecology | Admitting: Obstetrics and Gynecology

## 2022-10-16 DIAGNOSIS — Z1231 Encounter for screening mammogram for malignant neoplasm of breast: Secondary | ICD-10-CM | POA: Diagnosis not present

## 2022-11-17 DIAGNOSIS — E890 Postprocedural hypothyroidism: Secondary | ICD-10-CM | POA: Diagnosis not present

## 2022-12-16 IMAGING — CT CT ABD-PELV W/ CM
1 of 3 series · 13 of 32 positions shown, 18 images · IV contrast (APPLIED)
Comparison: Abdominopelvic CT 04/02/2017.

CLINICAL DATA: Left lower quadrant abdominal pain with nausea for 1
week. Starting antibiotics for diverticulitis. Previous appendectomy
and cholecystectomy.

EXAM:
CT ABDOMEN AND PELVIS WITH CONTRAST
TECHNIQUE: Multidetector CT imaging of the abdomen and pelvis was performed
using the standard protocol following bolus administration of
intravenous contrast.

[Series 2: abd/pelvis w/cm · axial · 0.98mm/px · z∈[-548,-108]mm · 13 of 102 slices shown, 18 images]
[im 7/102  soft-tissue]
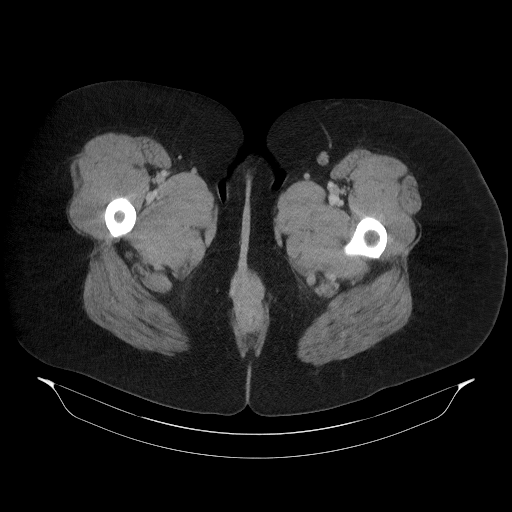
[im 7/102  bone]
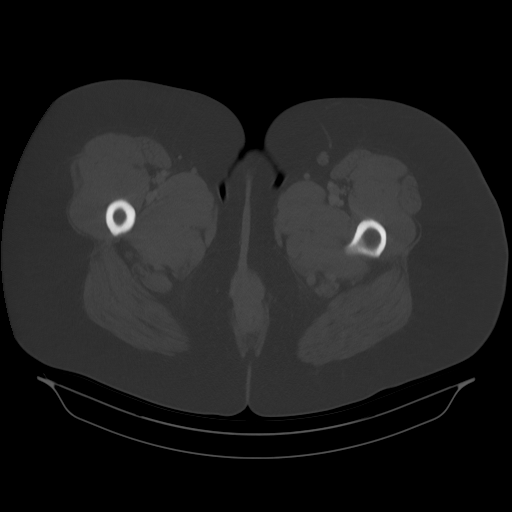
[im 13/102  soft-tissue]
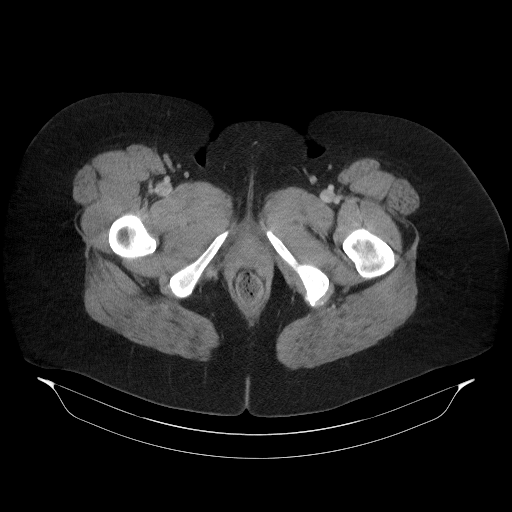
[im 26/102  soft-tissue]
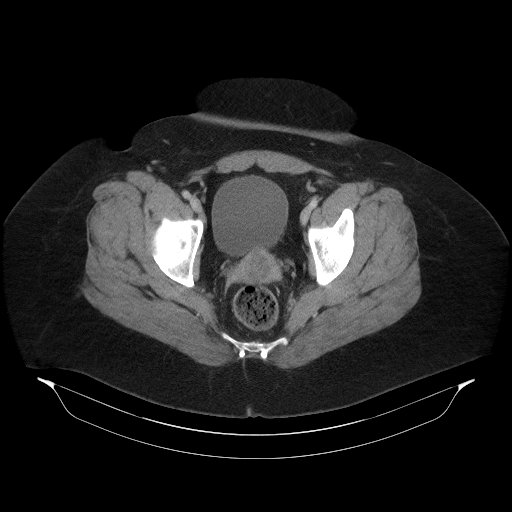
[im 32/102  soft-tissue]
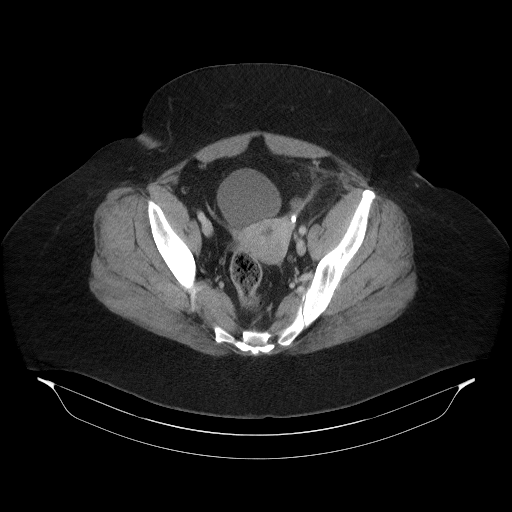
[im 38/102  soft-tissue]
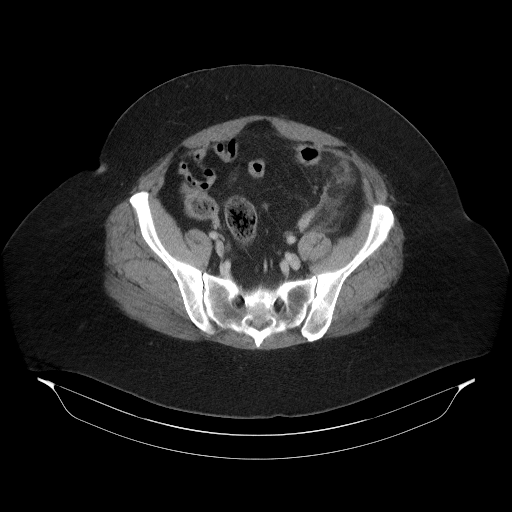
[im 45/102  soft-tissue]
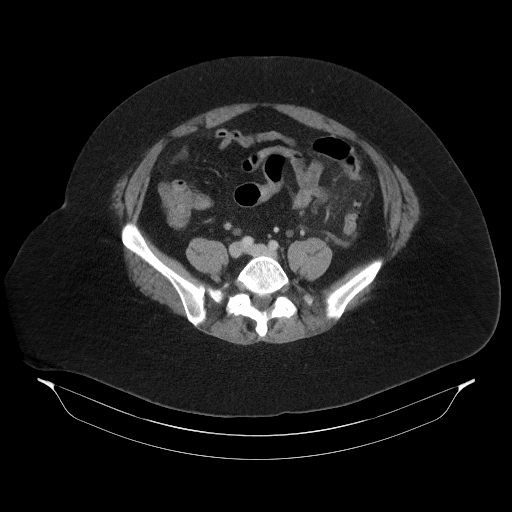
[im 57/102  soft-tissue]
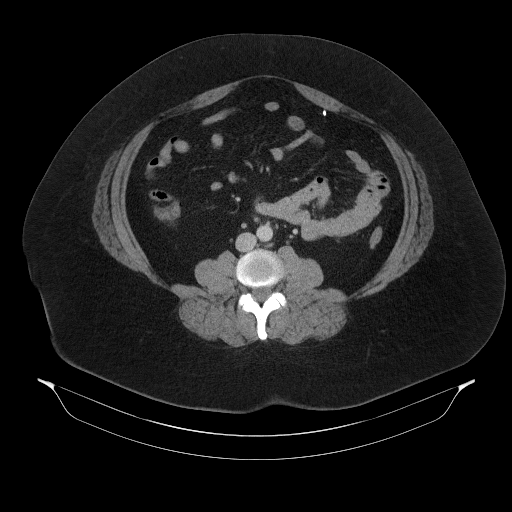
[im 64/102  soft-tissue]
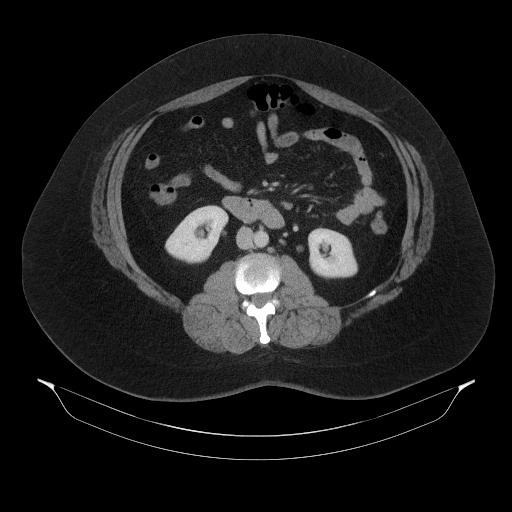
[im 70/102  soft-tissue]
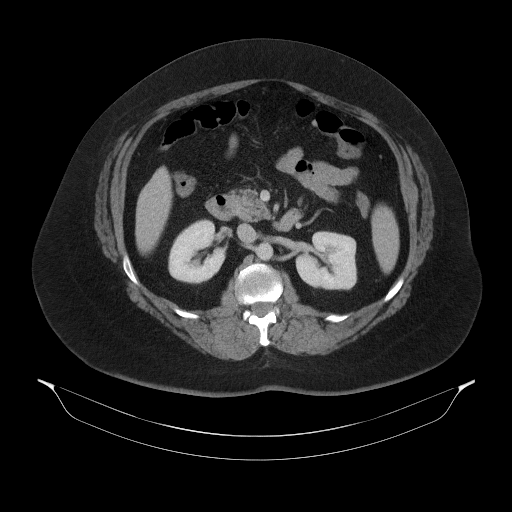
[im 70/102  bone]
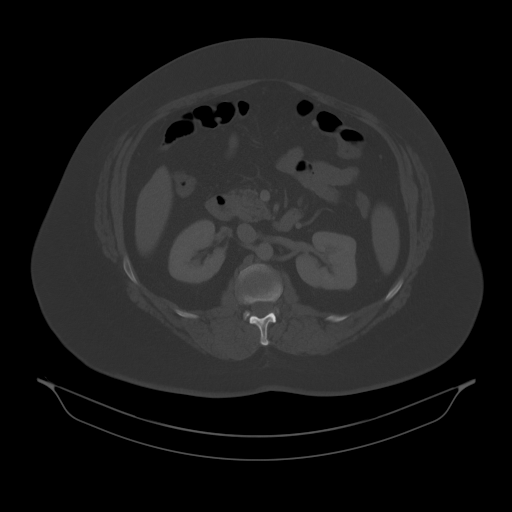
[im 76/102  soft-tissue]
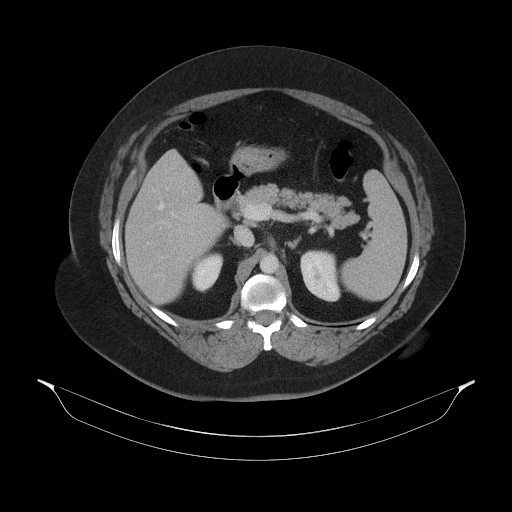
[im 76/102  lung]
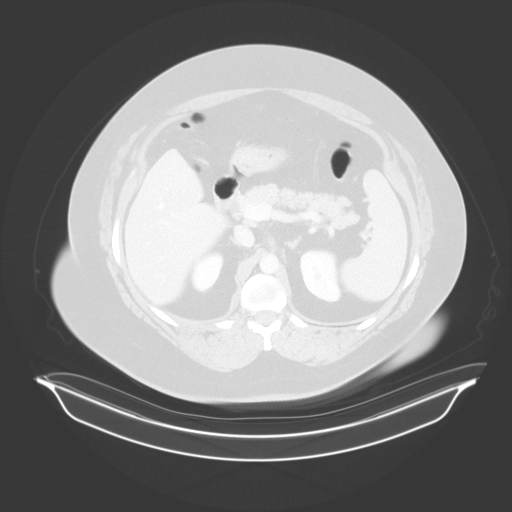
[im 83/102  lung]
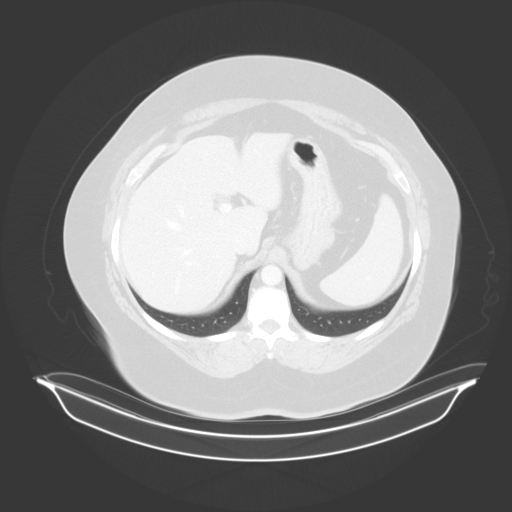
[im 89/102  soft-tissue]
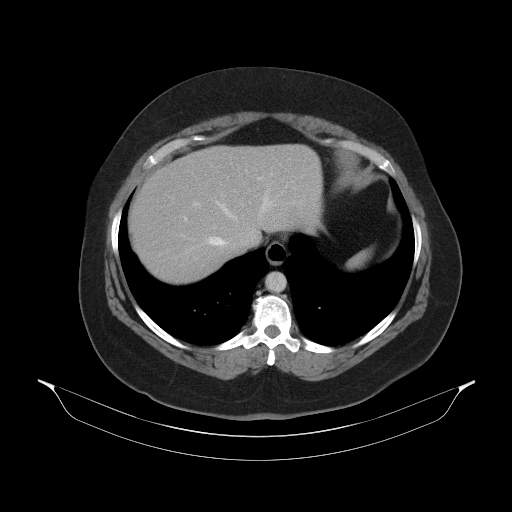
[im 89/102  lung]
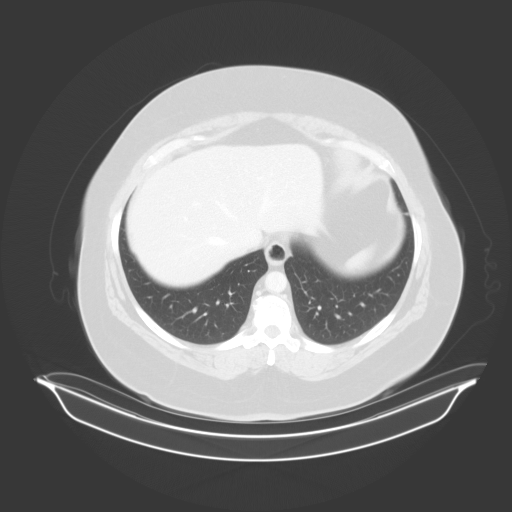
[im 95/102  soft-tissue]
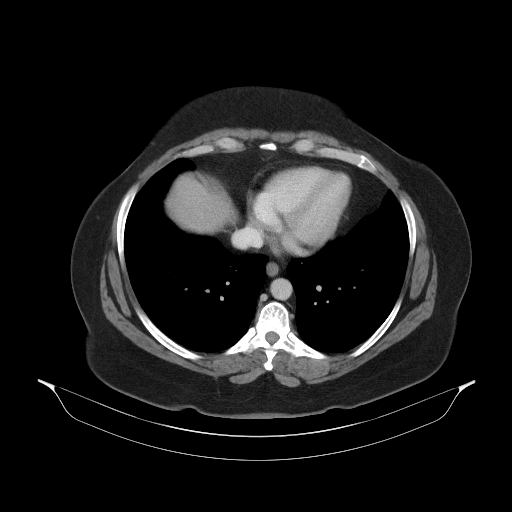
[im 95/102  lung]
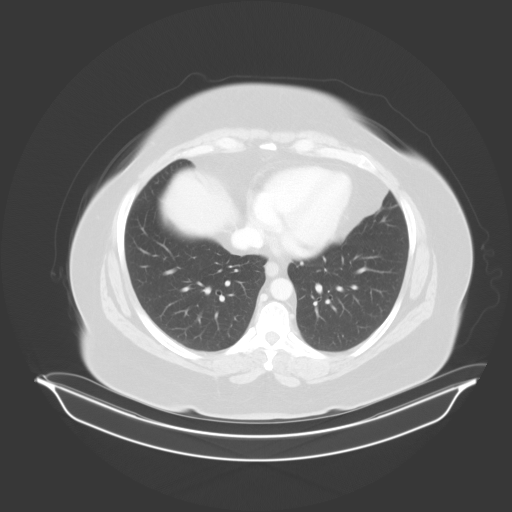

[13 of 32 positions shown; findings below may reference images not displayed]

RADIATION DOSE REDUCTION: This exam was performed according to the
departmental dose-optimization program which includes automated
exposure control, adjustment of the mA and/or kV according to
patient size and/or use of iterative reconstruction technique.

CONTRAST:  100mL VHIMD4-STT IOPAMIDOL (VHIMD4-STT) INJECTION 61%
FINDINGS: Lower chest: Clear lung bases. No significant pleural or pericardial
effusion.

Hepatobiliary: The liver is normal in density without suspicious
focal abnormality. No biliary dilatation status post
cholecystectomy.

Pancreas: Unremarkable. No pancreatic ductal dilatation or
surrounding inflammatory changes.

Spleen: Normal in size without focal abnormality.

Adrenals/Urinary Tract: Both adrenal glands appear normal. The
kidneys appear normal without evidence of urinary tract calculus,
suspicious lesion or hydronephrosis. No bladder abnormalities are
seen.

Stomach/Bowel: No enteric contrast administered. The stomach appears
unremarkable for its degree of distention. No small bowel
distension, wall thickening or surrounding inflammatory changes. The
proximal colon appears normal. There is recurrent wall thickening
and surrounding inflammation associated with the distal descending
and proximal sigmoid colon consistent with recurrent diverticulitis.
No evidence of bowel obstruction or perforation.

Vascular/Lymphatic: There are no enlarged abdominal or pelvic lymph
nodes. No significant vascular findings. The portal, superior
mesenteric and splenic veins are patent.

Reproductive: Nonspecific heterogeneous low-density in the cervix,
similar to previous study. There is also some low-density anteriorly
in the uterus which appears new and is nonspecific. No suspicious
adnexal findings.

Other: No evidence of abdominal wall mass or hernia. No ascites.

Musculoskeletal: No acute or significant osseous findings.
IMPRESSION: 1. Findings are consistent with recurrent distal descending and
proximal sigmoid colon diverticulitis. No evidence of bowel
perforation, abscess or obstruction.
2. Nonspecific low-density within the cervix and uterus. No
suspicious adnexal findings. These findings may be physiologic, and
could be further evaluated with pelvic ultrasound if clinically
warranted.
3. These results will be called to the ordering clinician or
representative by the Radiologist Assistant, and communication
documented in the PACS or [REDACTED].

## 2022-12-17 DIAGNOSIS — E890 Postprocedural hypothyroidism: Secondary | ICD-10-CM | POA: Diagnosis not present

## 2022-12-17 DIAGNOSIS — M79604 Pain in right leg: Secondary | ICD-10-CM | POA: Diagnosis not present

## 2022-12-17 DIAGNOSIS — M79661 Pain in right lower leg: Secondary | ICD-10-CM | POA: Diagnosis not present

## 2023-01-10 IMAGING — US US PELVIS COMPLETE WITH TRANSVAGINAL
1 series · 13 of 25 positions shown · non-contrast
Comparison: CT abdomen and pelvis 07/04/2021

CLINICAL DATA: Abnormal uterus on CT; unknown LMP, has been more
than 1 year



[Series 1: us pelvis complete with transvaginal · 0.25mm/px · 13 of 43 slices shown]
[im 1/43]
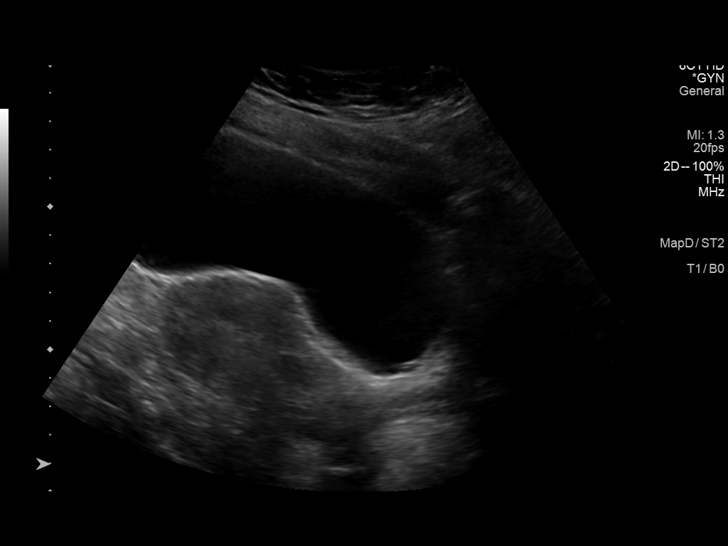
[im 4/43]
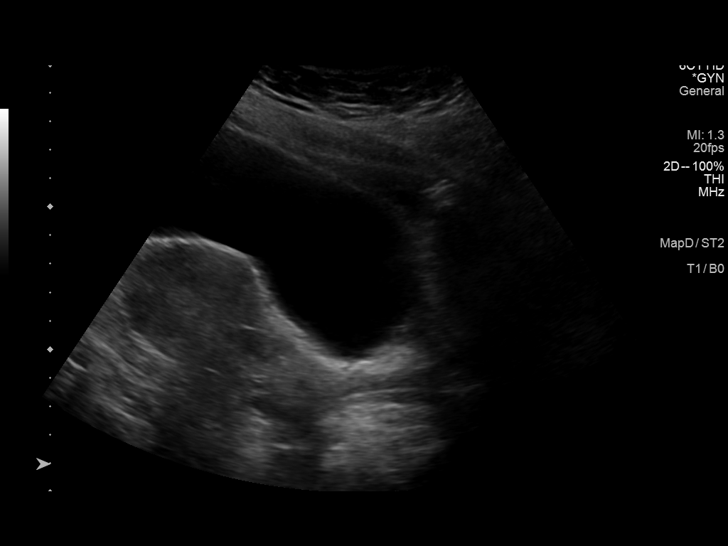
[im 8/43]
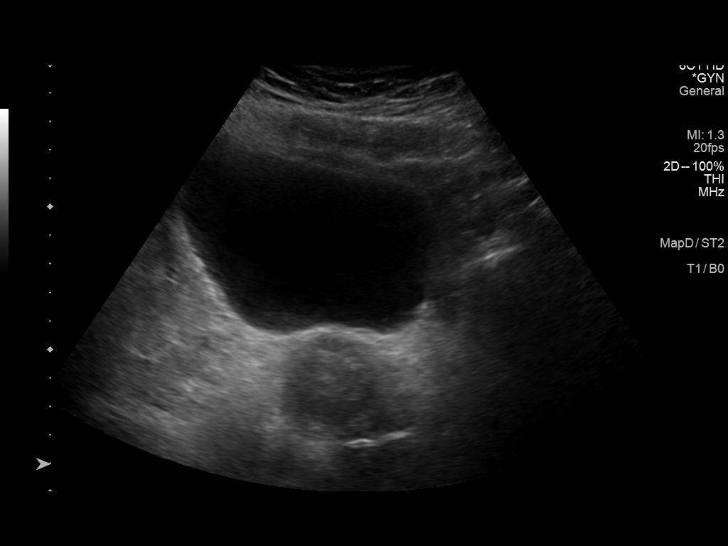
[im 11/43]
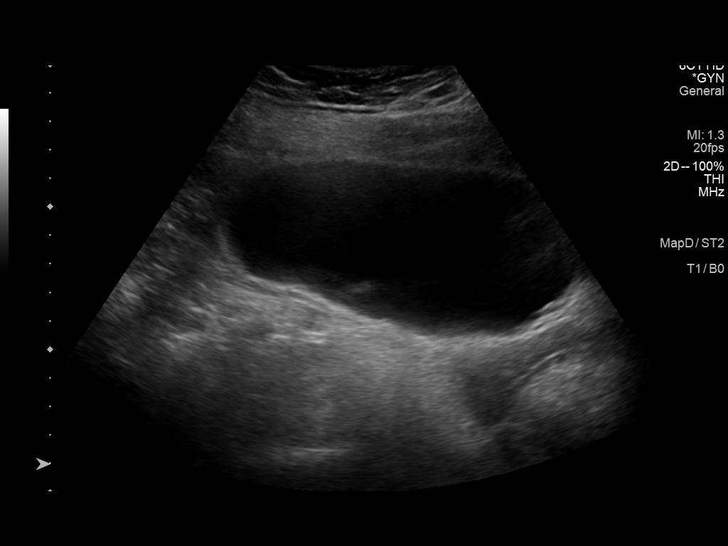
[im 15/43]
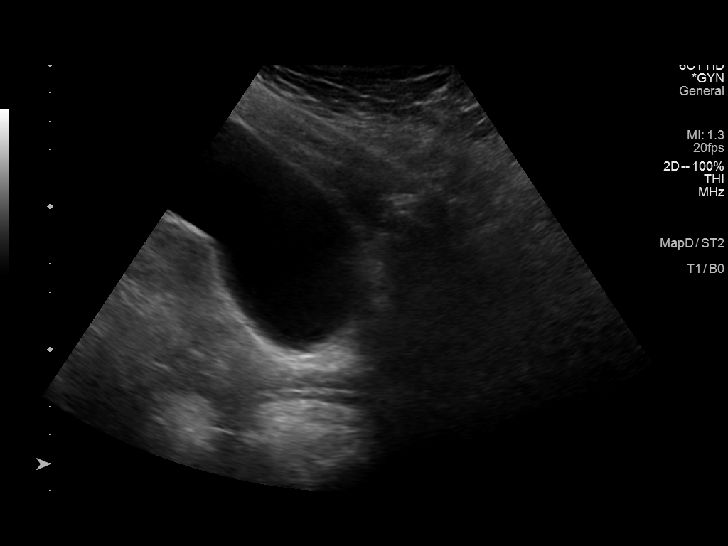
[im 18/43]
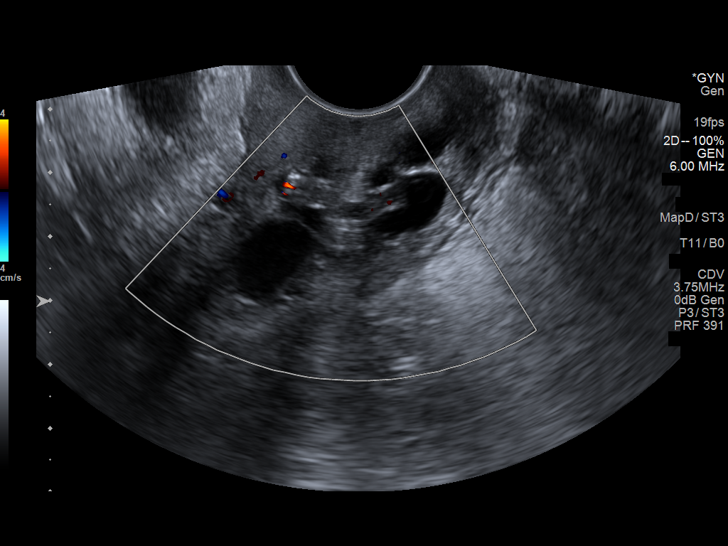
[im 22/43]
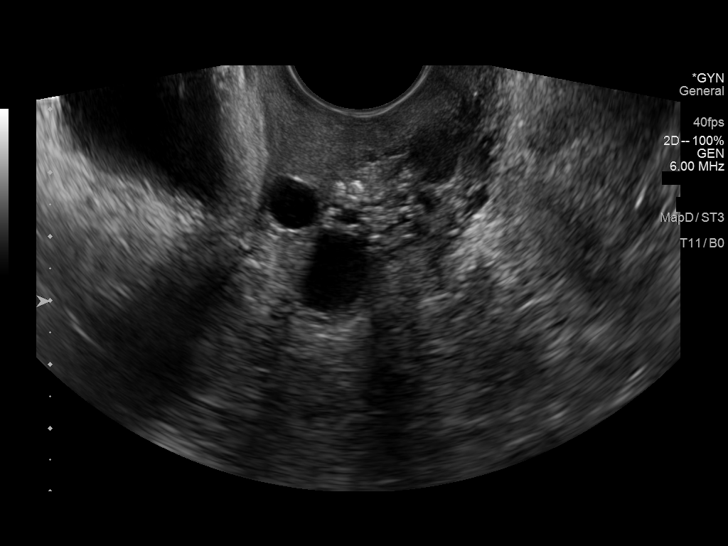
[im 25/43]
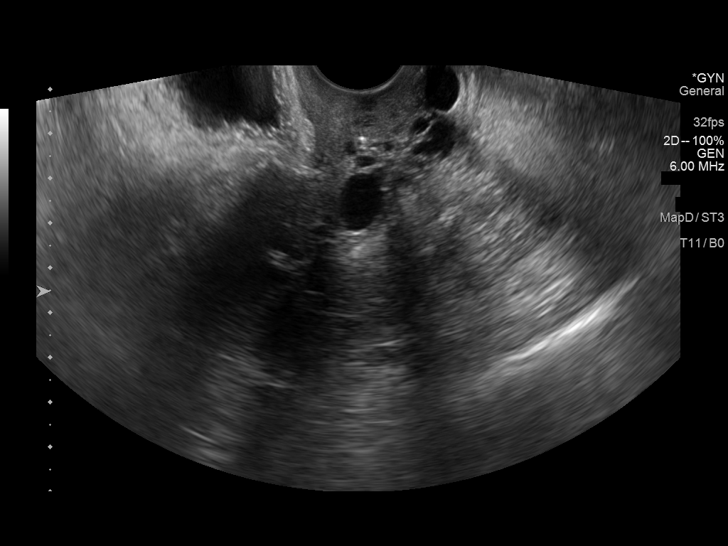
[im 29/43]
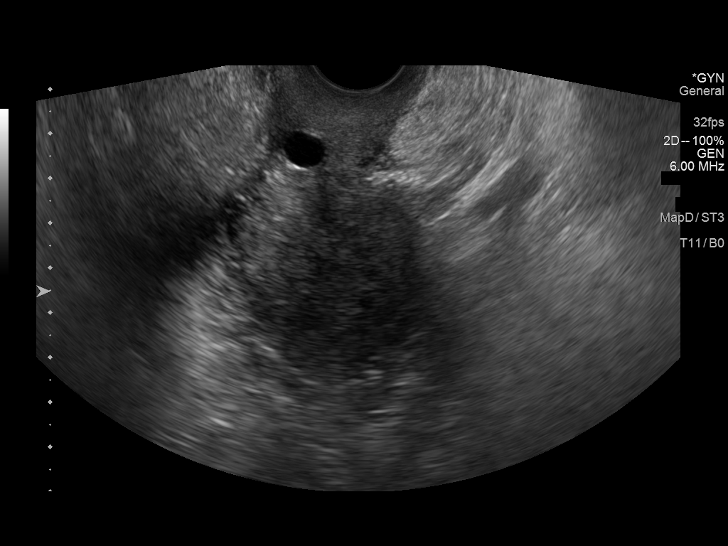
[im 32/43]
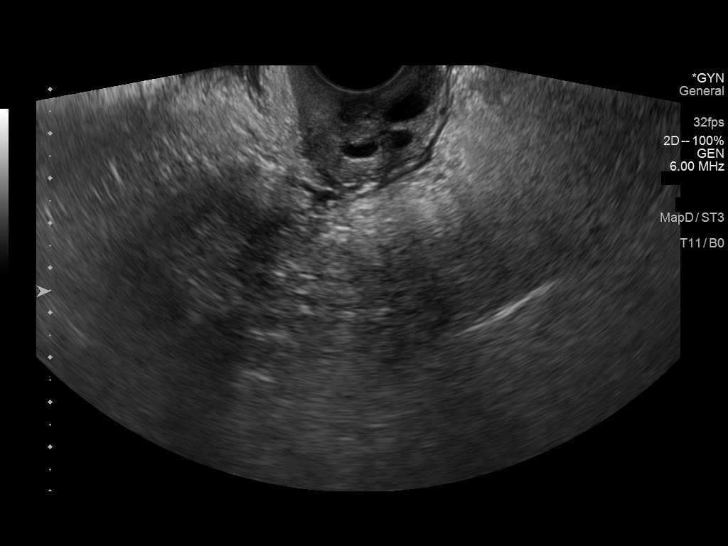
[im 36/43]
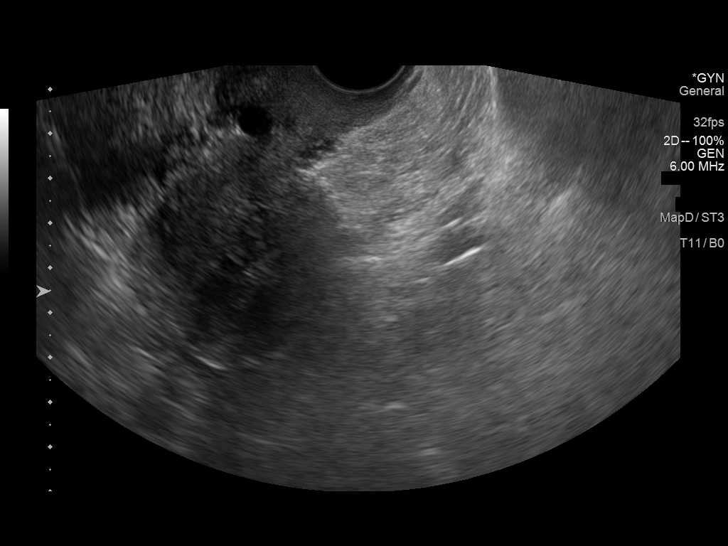
[im 39/43]
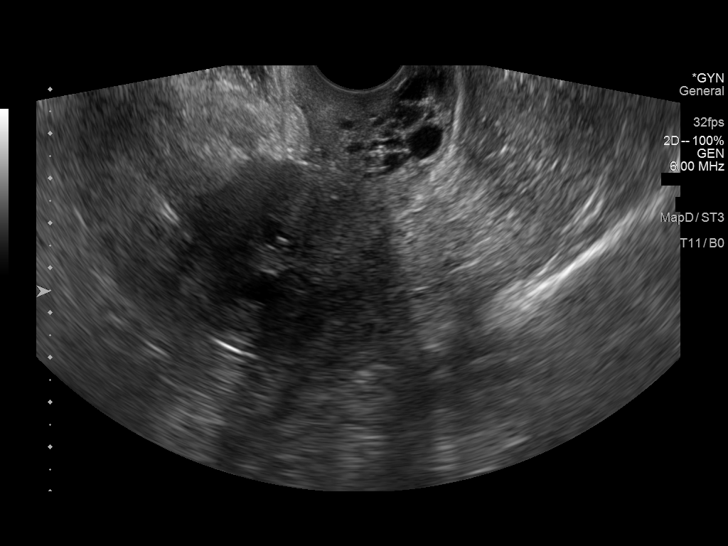
[im 43/43]
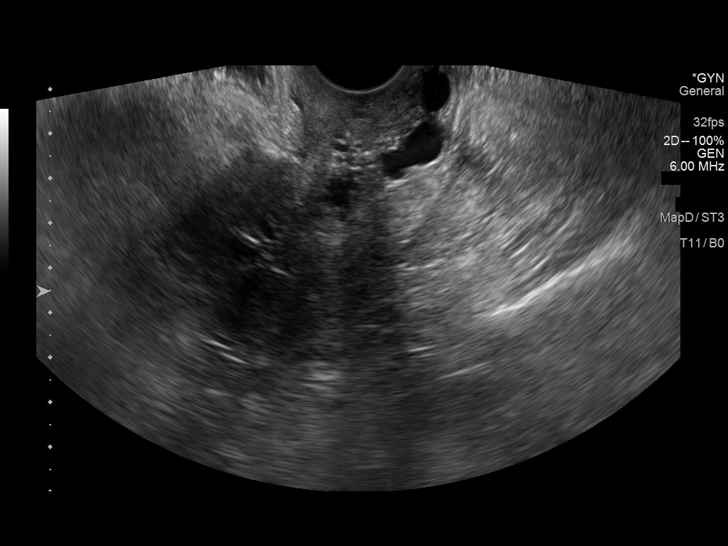

[13 of 25 positions shown; findings below may reference images not displayed]

FINDINGS: Uterus

Measurements: 8.5 x 4.7 x 4.5 cm = volume: 94 mL. Anteverted.
Numerous nabothian cysts in cervix. Probable small uterine
leiomyomata at upper uterine segment, 3.3 cm anterior, 1.8 cm
posterior.

Endometrium

Thickness: 8 mm. Poorly visualized on both transabdominal and
endovaginal imaging due to position and body habitus. No gross mass.

Right ovary

Not visualized, likely obscured by bowel

Left ovary

Not visualized, likely obscured by bowel

Other findings

No free pelvic fluid.  No adnexal masses.
IMPRESSION: Multiple nabothian cysts at cervix which account for CT finding.

Probable small uterine leiomyomata.

Nonvisualization of ovaries.

## 2023-01-26 DIAGNOSIS — R5383 Other fatigue: Secondary | ICD-10-CM | POA: Diagnosis not present

## 2023-01-26 DIAGNOSIS — I10 Essential (primary) hypertension: Secondary | ICD-10-CM | POA: Diagnosis not present

## 2023-01-26 DIAGNOSIS — E890 Postprocedural hypothyroidism: Secondary | ICD-10-CM | POA: Diagnosis not present

## 2023-01-26 DIAGNOSIS — Z13220 Encounter for screening for lipoid disorders: Secondary | ICD-10-CM | POA: Diagnosis not present

## 2023-01-26 DIAGNOSIS — Z0000 Encounter for general adult medical examination without abnormal findings: Secondary | ICD-10-CM | POA: Diagnosis not present
